# Patient Record
Sex: Male | Born: 1950
Health system: Southern US, Community
[De-identification: ages and names within clinical notes are randomized; demographics above are authoritative.]

## PROBLEM LIST (undated history)

## (undated) DIAGNOSIS — E7800 Pure hypercholesterolemia, unspecified: Secondary | ICD-10-CM

## (undated) DIAGNOSIS — K219 Gastro-esophageal reflux disease without esophagitis: Secondary | ICD-10-CM

## (undated) DIAGNOSIS — M5416 Radiculopathy, lumbar region: Secondary | ICD-10-CM

## (undated) DIAGNOSIS — R972 Elevated prostate specific antigen [PSA]: Secondary | ICD-10-CM

## (undated) DIAGNOSIS — R3915 Urgency of urination: Secondary | ICD-10-CM

## (undated) DIAGNOSIS — I10 Essential (primary) hypertension: Secondary | ICD-10-CM

## (undated) DIAGNOSIS — M549 Dorsalgia, unspecified: Secondary | ICD-10-CM

## (undated) DIAGNOSIS — IMO0002 Reserved for concepts with insufficient information to code with codable children: Secondary | ICD-10-CM

## (undated) DIAGNOSIS — M1990 Unspecified osteoarthritis, unspecified site: Secondary | ICD-10-CM

## (undated) DIAGNOSIS — F329 Major depressive disorder, single episode, unspecified: Secondary | ICD-10-CM

## (undated) DIAGNOSIS — F32A Depression, unspecified: Secondary | ICD-10-CM

## (undated) DIAGNOSIS — S0291XA Unspecified fracture of skull, initial encounter for closed fracture: Secondary | ICD-10-CM

## (undated) DIAGNOSIS — M545 Low back pain, unspecified: Secondary | ICD-10-CM

## (undated) DIAGNOSIS — G8929 Other chronic pain: Secondary | ICD-10-CM

## (undated) DIAGNOSIS — M48061 Spinal stenosis, lumbar region without neurogenic claudication: Secondary | ICD-10-CM

## (undated) DIAGNOSIS — N400 Enlarged prostate without lower urinary tract symptoms: Secondary | ICD-10-CM

## (undated) HISTORY — PX: KNEE ARTHROSCOPY: SUR90

## (undated) HISTORY — PX: COLONOSCOPY: SHX174

---

## 1980-12-02 DIAGNOSIS — S0291XA Unspecified fracture of skull, initial encounter for closed fracture: Secondary | ICD-10-CM

## 1980-12-02 HISTORY — DX: Unspecified fracture of skull, initial encounter for closed fracture: S02.91XA

## 2005-09-16 ENCOUNTER — Emergency Department: Payer: Self-pay | Admitting: Unknown Physician Specialty

## 2006-10-02 ENCOUNTER — Ambulatory Visit: Payer: Self-pay | Admitting: Internal Medicine

## 2008-10-08 ENCOUNTER — Ambulatory Visit: Payer: Self-pay | Admitting: Internal Medicine

## 2008-10-21 ENCOUNTER — Encounter: Admission: RE | Admit: 2008-10-21 | Discharge: 2008-10-21 | Payer: Self-pay | Admitting: Neurological Surgery

## 2008-12-02 HISTORY — PX: CHOLECYSTECTOMY: SHX55

## 2008-12-02 HISTORY — PX: BACK SURGERY: SHX140

## 2008-12-15 ENCOUNTER — Inpatient Hospital Stay (HOSPITAL_COMMUNITY): Admission: RE | Admit: 2008-12-15 | Discharge: 2008-12-16 | Payer: Self-pay | Admitting: Neurological Surgery

## 2008-12-23 ENCOUNTER — Encounter: Admission: RE | Admit: 2008-12-23 | Discharge: 2008-12-23 | Payer: Self-pay | Admitting: Neurological Surgery

## 2009-01-31 ENCOUNTER — Encounter: Admission: RE | Admit: 2009-01-31 | Discharge: 2009-01-31 | Payer: Self-pay | Admitting: Neurological Surgery

## 2009-02-20 ENCOUNTER — Encounter: Admission: RE | Admit: 2009-02-20 | Discharge: 2009-02-20 | Payer: Self-pay | Admitting: Neurological Surgery

## 2009-03-14 ENCOUNTER — Encounter: Admission: RE | Admit: 2009-03-14 | Discharge: 2009-03-14 | Payer: Self-pay | Admitting: Neurological Surgery

## 2009-05-05 ENCOUNTER — Encounter: Admission: RE | Admit: 2009-05-05 | Discharge: 2009-05-05 | Payer: Self-pay | Admitting: Neurological Surgery

## 2009-05-15 ENCOUNTER — Encounter: Admission: RE | Admit: 2009-05-15 | Discharge: 2009-05-15 | Payer: Self-pay | Admitting: Neurological Surgery

## 2009-05-15 ENCOUNTER — Inpatient Hospital Stay (HOSPITAL_COMMUNITY): Admission: EM | Admit: 2009-05-15 | Discharge: 2009-05-19 | Payer: Self-pay | Admitting: Emergency Medicine

## 2009-05-16 ENCOUNTER — Encounter (INDEPENDENT_AMBULATORY_CARE_PROVIDER_SITE_OTHER): Payer: Self-pay | Admitting: Surgery

## 2009-07-18 ENCOUNTER — Encounter: Admission: RE | Admit: 2009-07-18 | Discharge: 2009-07-18 | Payer: Self-pay | Admitting: Neurological Surgery

## 2009-07-25 ENCOUNTER — Encounter: Admission: RE | Admit: 2009-07-25 | Discharge: 2009-07-25 | Payer: Self-pay | Admitting: Neurological Surgery

## 2009-08-23 ENCOUNTER — Inpatient Hospital Stay (HOSPITAL_COMMUNITY): Admission: RE | Admit: 2009-08-23 | Discharge: 2009-08-25 | Payer: Self-pay | Admitting: Neurological Surgery

## 2009-09-04 ENCOUNTER — Ambulatory Visit: Payer: Self-pay | Admitting: Neurological Surgery

## 2009-09-12 ENCOUNTER — Encounter: Admission: RE | Admit: 2009-09-12 | Discharge: 2009-09-12 | Payer: Self-pay | Admitting: Neurological Surgery

## 2009-11-21 ENCOUNTER — Encounter: Admission: RE | Admit: 2009-11-21 | Discharge: 2009-11-21 | Payer: Self-pay | Admitting: Neurological Surgery

## 2009-12-22 ENCOUNTER — Encounter: Admission: RE | Admit: 2009-12-22 | Discharge: 2009-12-22 | Payer: Self-pay | Admitting: Neurological Surgery

## 2010-05-07 ENCOUNTER — Encounter: Admission: RE | Admit: 2010-05-07 | Discharge: 2010-05-07 | Payer: Self-pay | Admitting: Neurological Surgery

## 2010-08-04 IMAGING — CR DG LUMBAR SPINE 2-3V
3 series · 3 of 3 positions shown · non-contrast
Comparison: Lumbar spine films of 03/14/2009

CLINICAL DATA: Lumbar spine surgery in December 2008, pain in the
low back, follow-up

LUMBAR SPINE - 2-3 VIEW

[t l-spine a.p.]
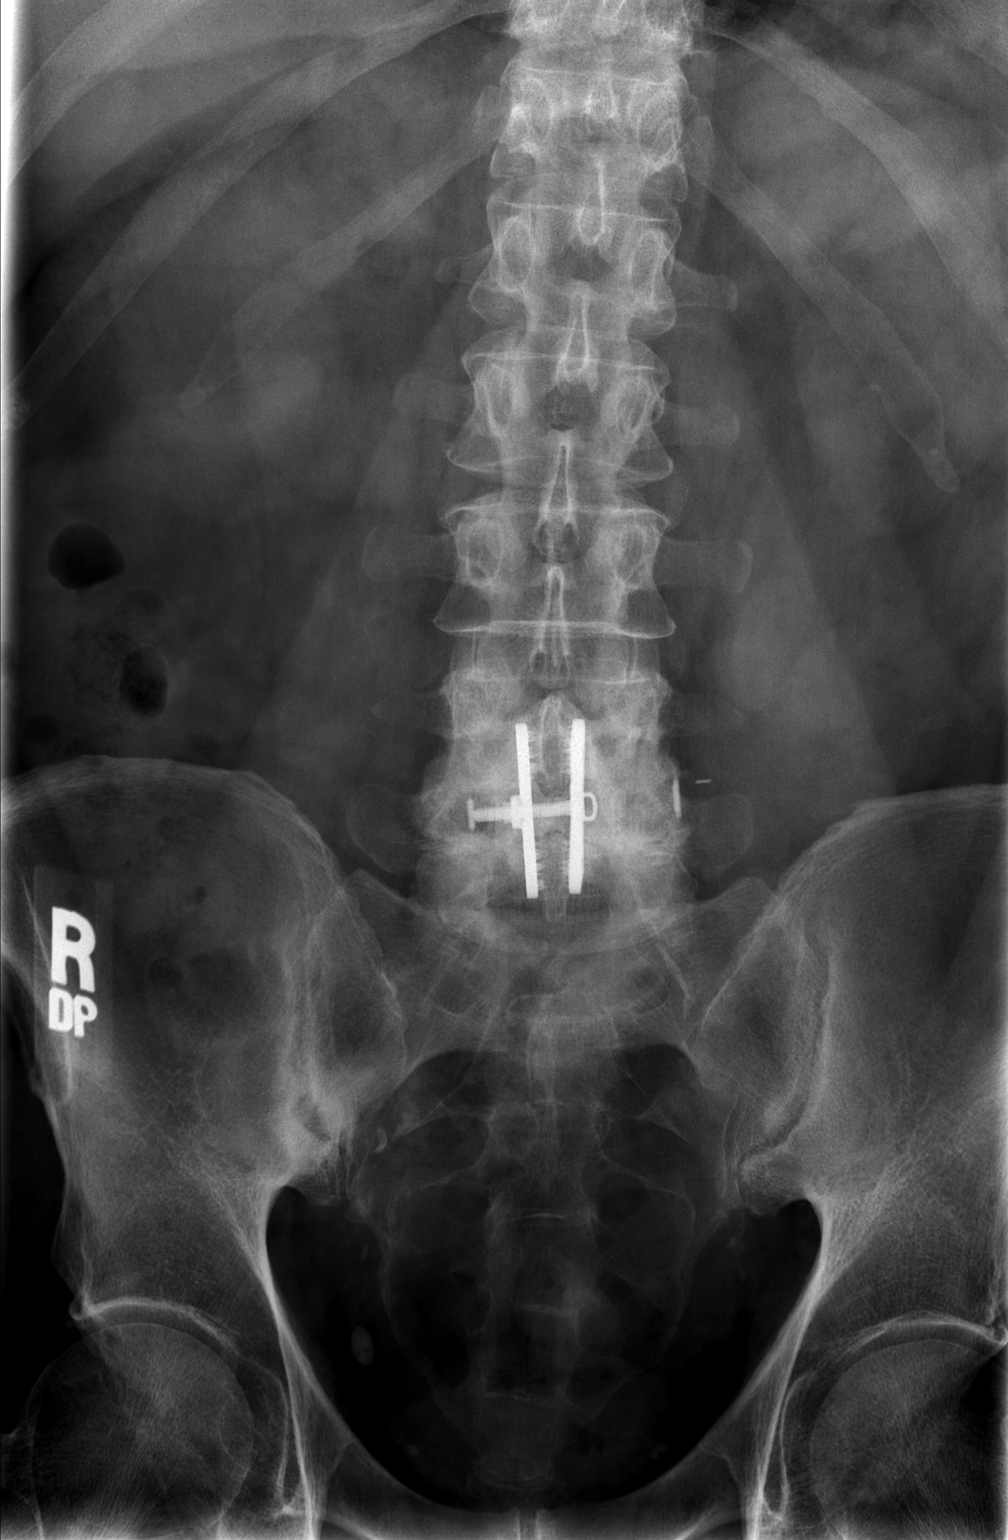

[t l-spine lat]
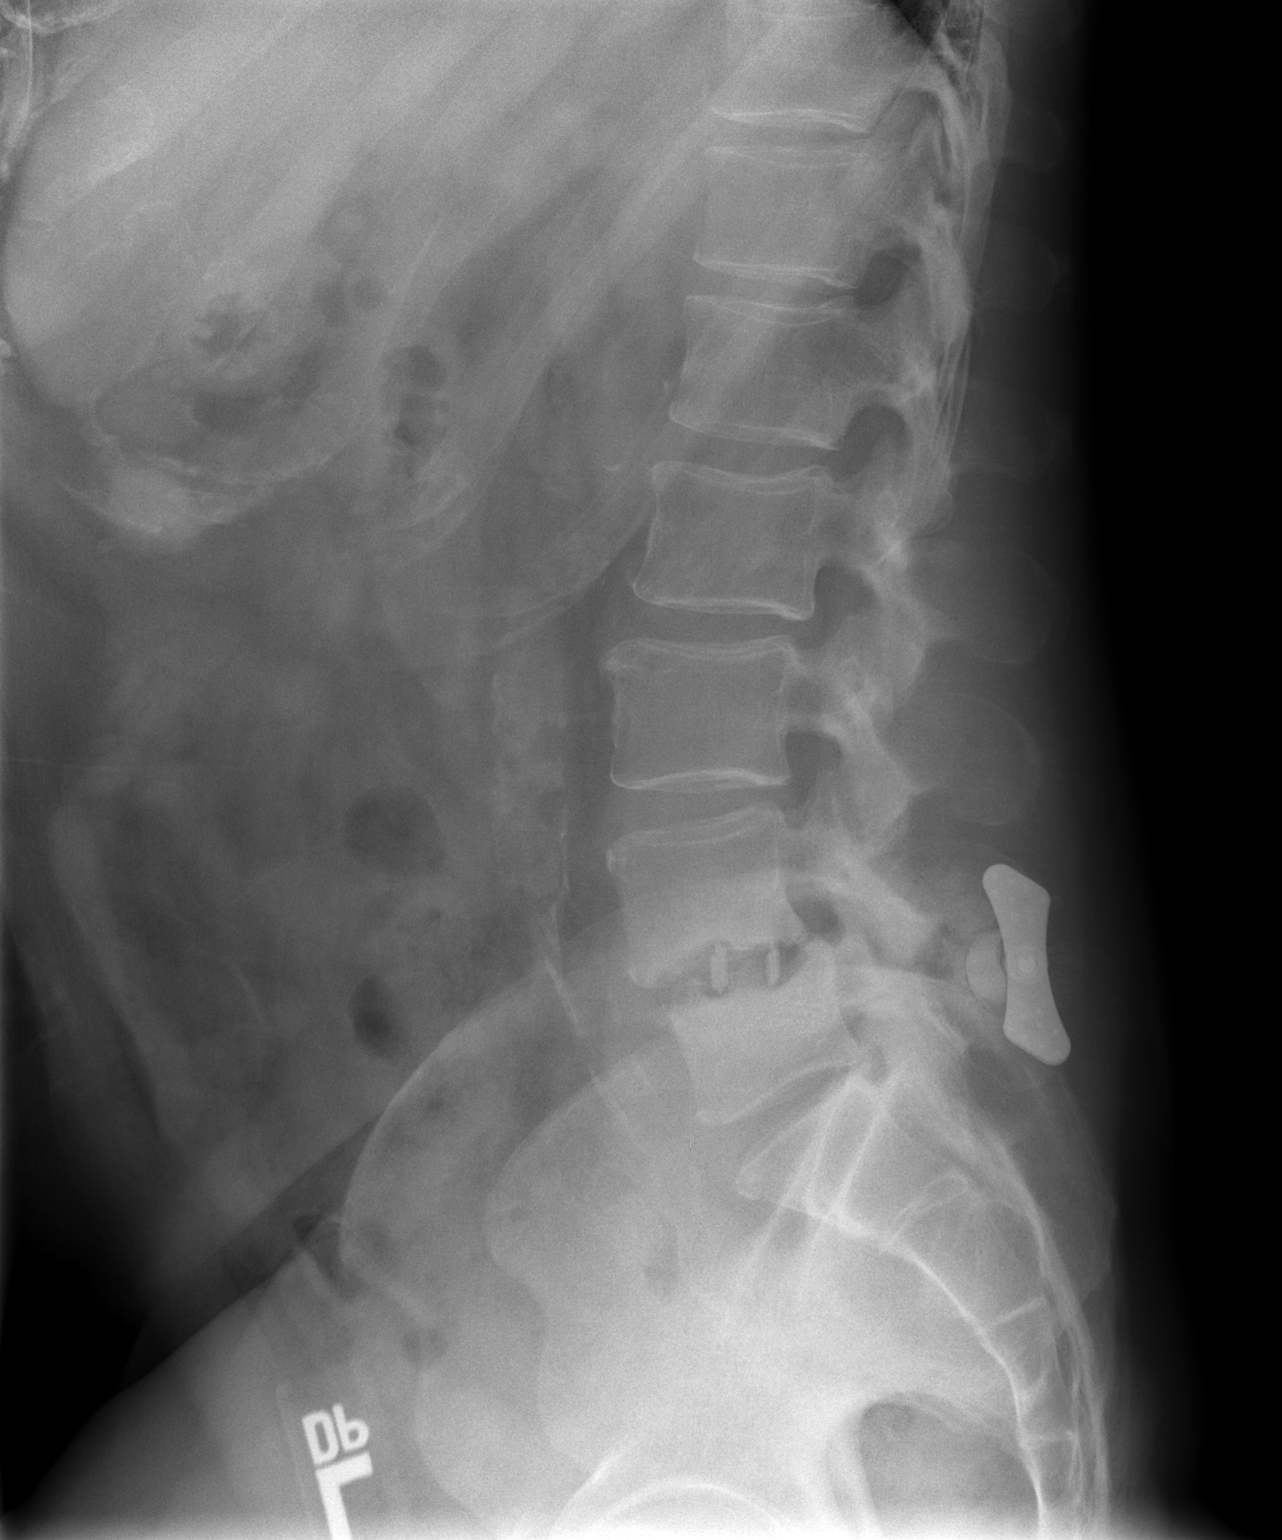

[t l-spine l5-s1 spot]
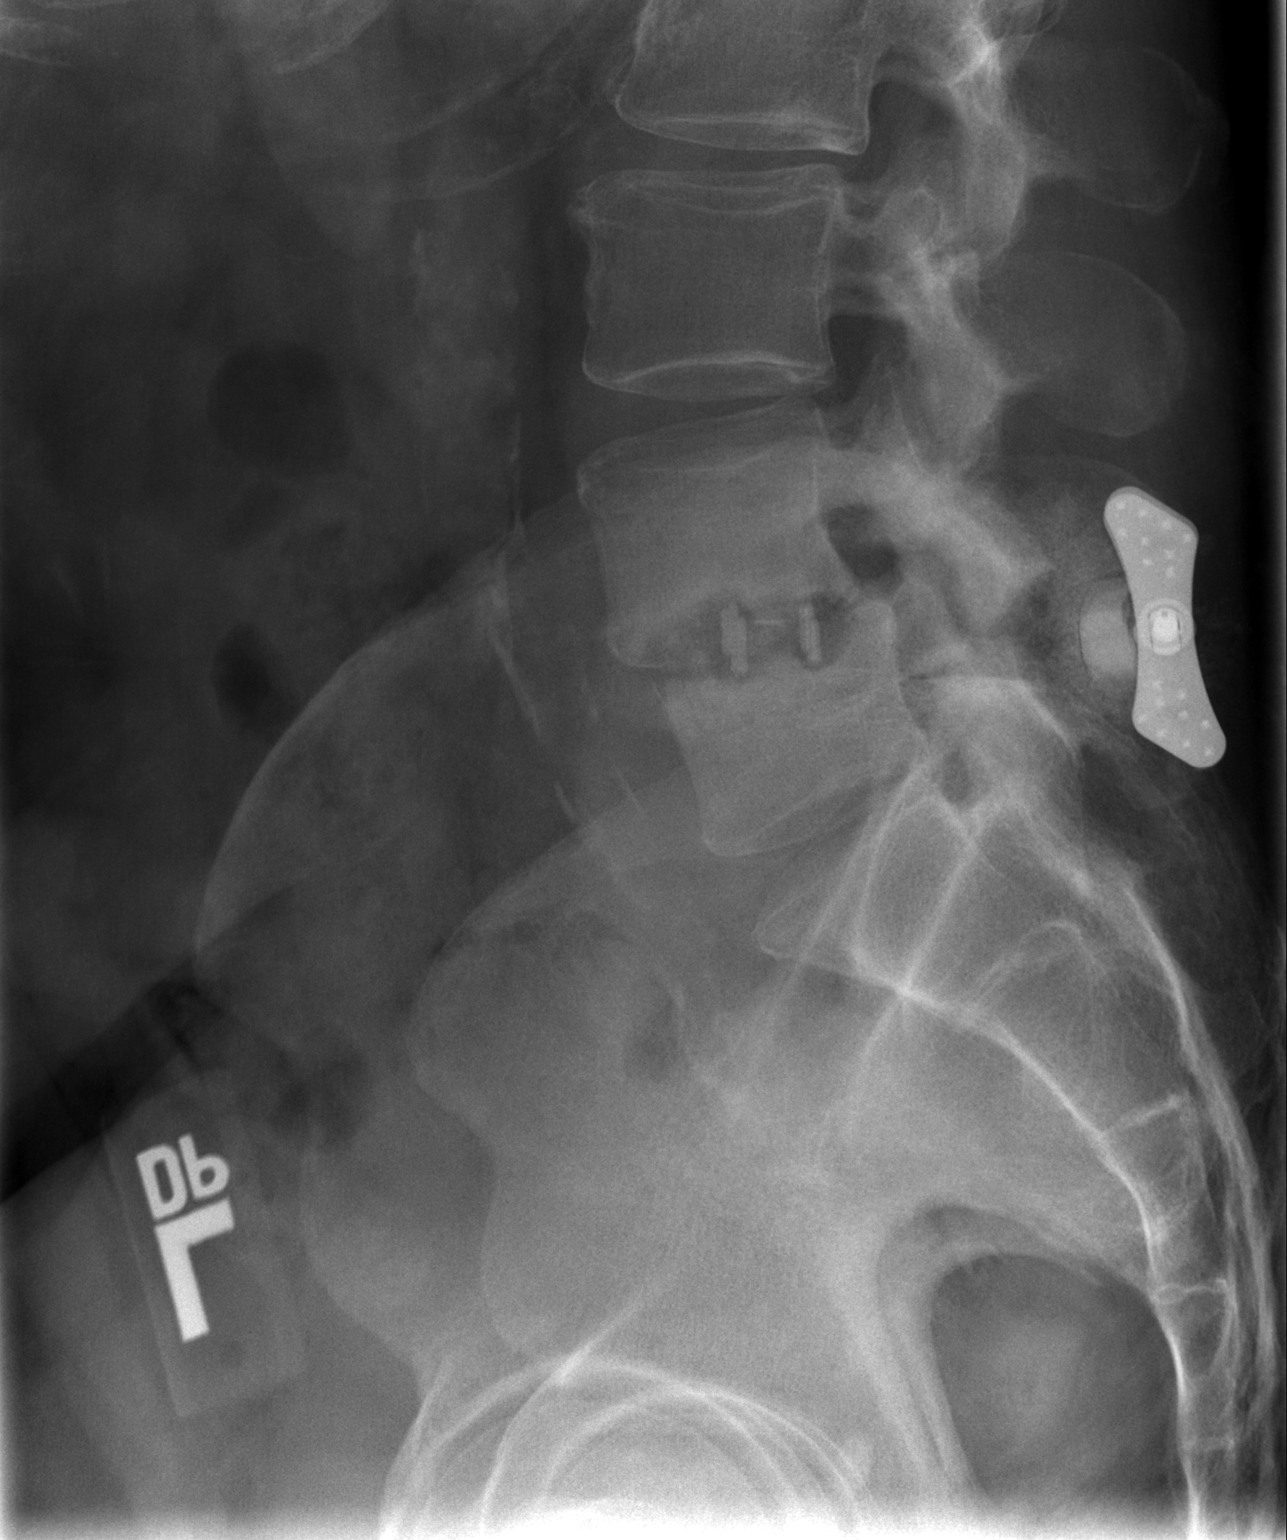

[3 of 3 positions shown; findings below may reference images not displayed]

FINDINGS: The previously noted lucency surrounding the interbody
spacer at L4-5 has increased somewhat, which may indicate
resorption or possibly infection.  Again some subsidence of the
interbody spacer at L4-5 into the superior aspect of L5 is noted.
There is little change in very mild anterolisthesis of L4 on L5.
Interspinous spacer appears unchanged.  No other significant change
is seen.
IMPRESSION: 1.  Slightly more lucency surrounds the interbody spacer at L4-5
which may indicate resorption or possibly infection.
 2.  Little change to slight increase in subsidence into the
superior aspect of L5.

## 2010-08-14 IMAGING — CT CT L SPINE W/O CM
4 of 11 series · 10 of 33 positions shown, 12 images · non-contrast
Comparison: Plain film series from [DATE], [DATE], [DATE] [DATE].

CLINICAL DATA: Low back pain.  Bilateral leg pain.

CT LUMBAR SPINE WITHOUT CONTRAST
TECHNIQUE: Multidetector CT imaging of the lumbar spine was
performed without intravenous contrast administration. Multiplanar
CT image reconstructions were also generated.

[Series 3: bone windows · axial · 0.27mm/px · z∈[-223,-143]mm · 2 of 97 slices shown, 3 images]
[im 33/97  soft-tissue]
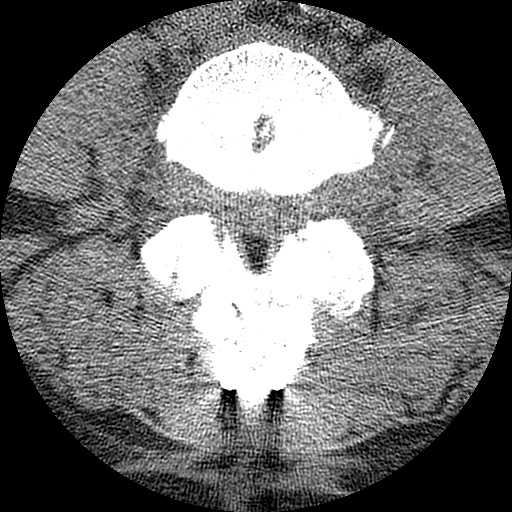
[im 33/97  bone]
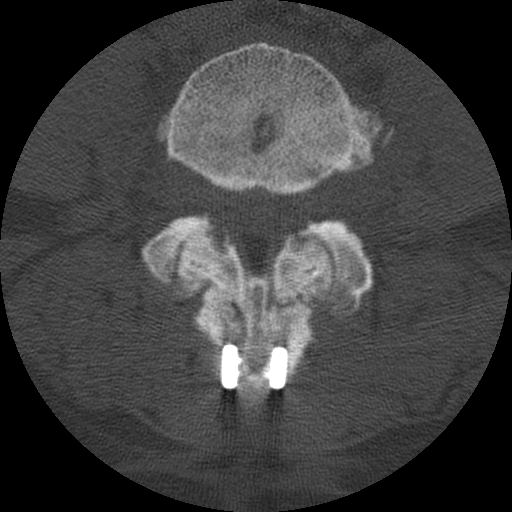
[im 65/97  bone]
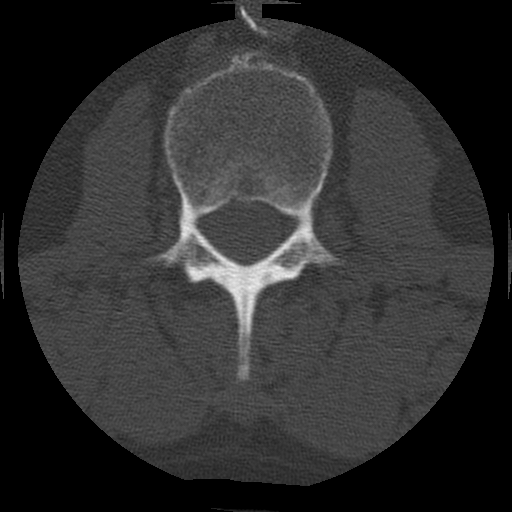

[Series 4: detail windows · axial · 0.27mm/px · z∈[-223,-143]mm · 2 of 97 slices shown]
[im 33/97  bone]
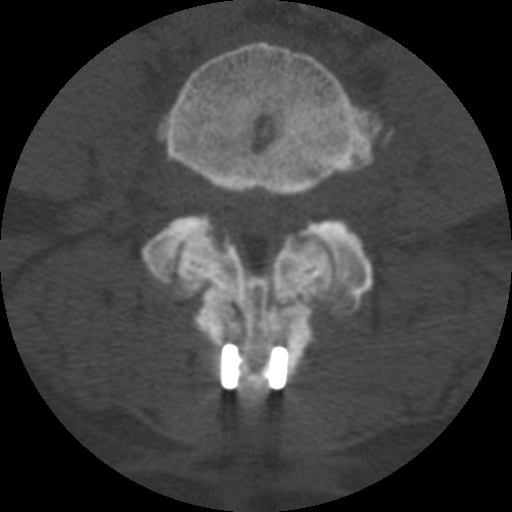
[im 65/97  bone]
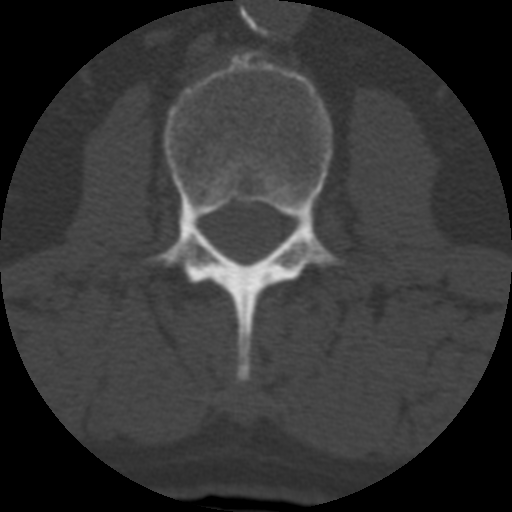

[Series 201: cor lower l-spine · coronal · 0.48mm/px · 1 of 46 slices shown]
[im 23/46  bone]
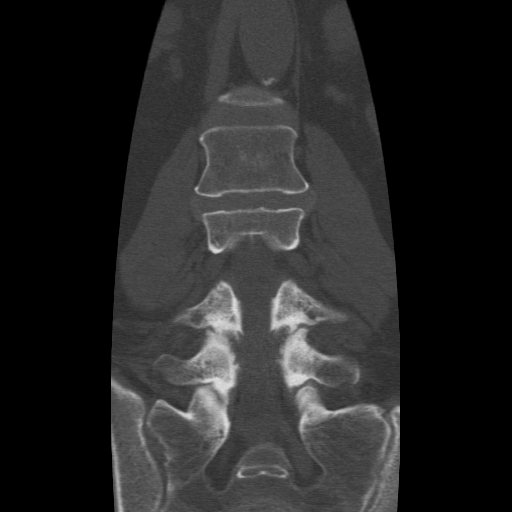

[Series 202: sag l-spine · sagittal · 0.48mm/px · 5 of 44 slices shown, 6 images]
[im 15/44  bone]
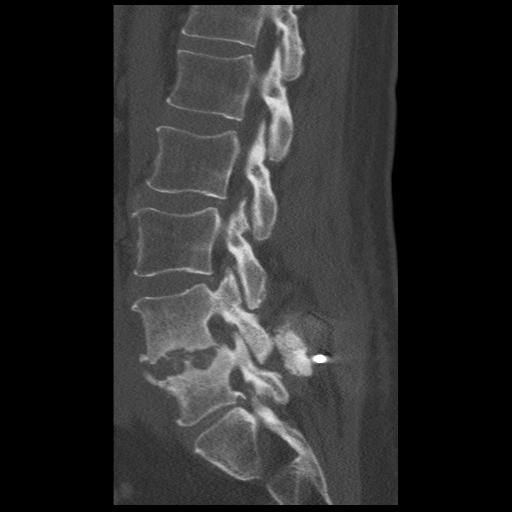
[im 18/44  bone]
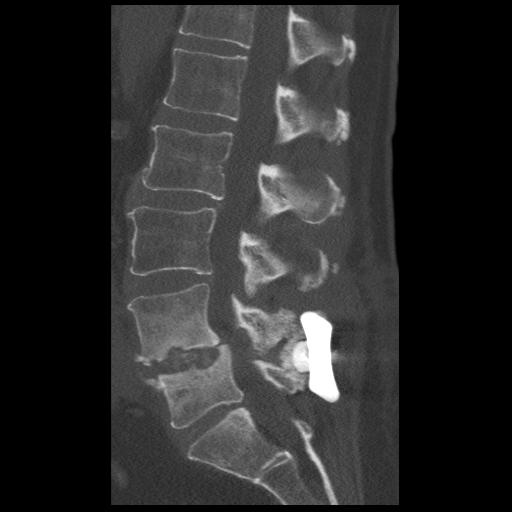
[im 22/44  soft-tissue]
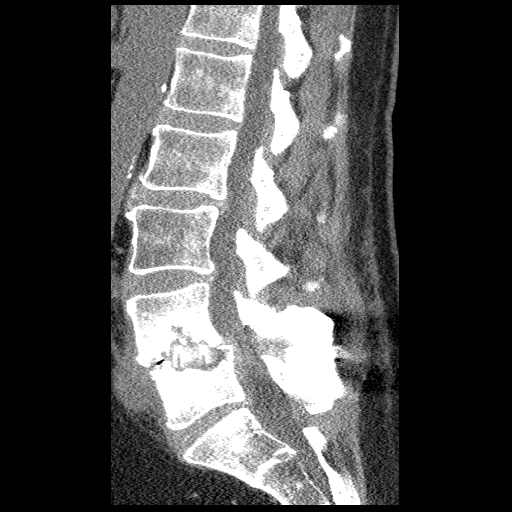
[im 22/44  bone]
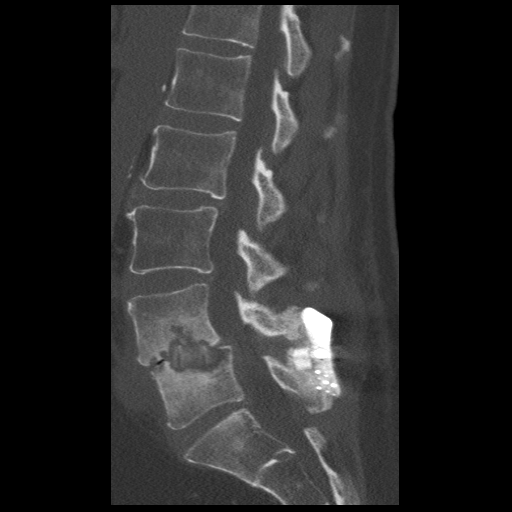
[im 26/44  bone]
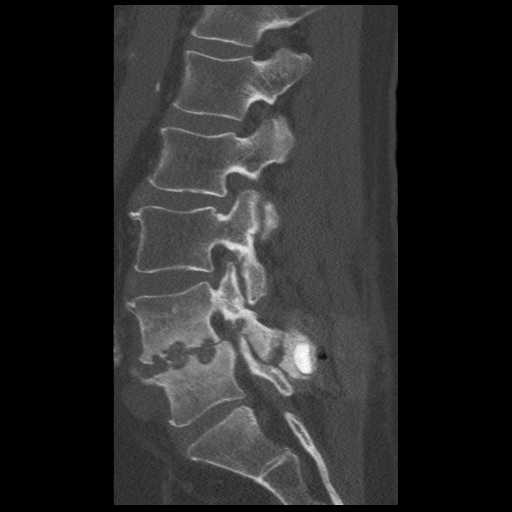
[im 29/44  bone]
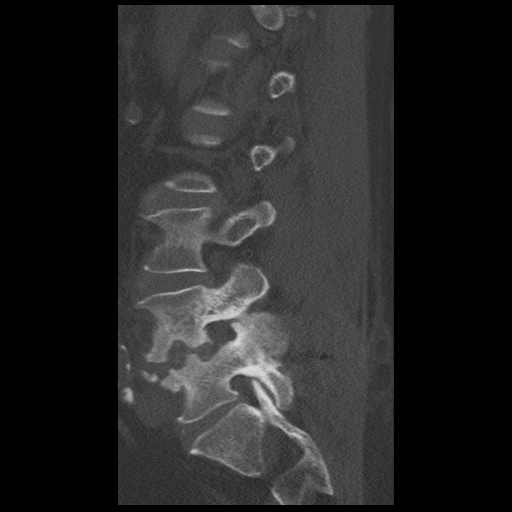

[10 of 33 positions shown; findings below may reference images not displayed]

FINDINGS: The grade 1 anterolisthesis of L4 on L5 appears little
changed compared to the preoperative radiograph of 10/21/2008.
There are however destructive changes of bone on both sides of the
L4-L5 endplate.  Disc spacer is present at L4-L5 and an X-STOP (or
similar) device is present posteriorly, with posterior fusion mass
around the spinous processes of L4 and L5.  Compared to prior plain
films, there has been subsidence of the interbody spacer into the
endplates of L4 and L5, with surrounding sclerosis.  The foramina
at L4-L5 are mildly stenotic associated with loss of disc space and
soft tissue in the inferior aspect of the foramina bilaterally
which could represent residual disc material versus scar/fibrosis.

T12-L1, L1-L2 appear normal.  L2-L3 shows circumferential disc
bulge with partially calcified disc.  Mild central stenosis is
present associated with ligamentum flavum redundancy and bulging
disc.  The disc extends into both foramina with left greater than
right foraminal stenosis.

L3-L4 also shows a broad-based posterior disc bulge which extends
into both neural foramina, producing left greater than right
foraminal stenosis.

L5-S1 shows bony right foraminal stenosis.  The central canal
appears adequately patent.  There is a broad-based posterior disc
bulge.  No definite left foraminal stenosis is identified.
Endplate osteophyte as well as disc contribute to right foraminal
stenosis.
IMPRESSION: 1.  Postoperative changes L4-L5 with osteolysis around the
interbody spacer and sclerosis of the adjacent vertebral endplates
beyond the zone of osteolysis.  The appearance is most consistent
with ongoing motion at this level, with infection felt less likely
based on sclerosis.  Spinous process fixation device posteriorly
does not show definite loosening and the bone graft is coalescing.
2.  L2-L3 and L3-L4 broad-based disc bulges.
3.  Right L5 bony foraminal stenosis associated with endplate spur.

## 2010-11-20 ENCOUNTER — Encounter
Admission: RE | Admit: 2010-11-20 | Discharge: 2010-11-20 | Payer: Self-pay | Source: Home / Self Care | Attending: Neurological Surgery | Admitting: Neurological Surgery

## 2011-03-08 LAB — URINALYSIS, ROUTINE W REFLEX MICROSCOPIC
Ketones, ur: NEGATIVE mg/dL
Nitrite: NEGATIVE
Protein, ur: NEGATIVE mg/dL
pH: 5.5 (ref 5.0–8.0)

## 2011-03-08 LAB — COMPREHENSIVE METABOLIC PANEL
Albumin: 4.3 g/dL (ref 3.5–5.2)
BUN: 14 mg/dL (ref 6–23)
Calcium: 9.9 mg/dL (ref 8.4–10.5)
Chloride: 104 mEq/L (ref 96–112)
Creatinine, Ser: 0.81 mg/dL (ref 0.4–1.5)
Total Bilirubin: 0.5 mg/dL (ref 0.3–1.2)

## 2011-03-08 LAB — ABO/RH: ABO/RH(D): A POS

## 2011-03-08 LAB — BASIC METABOLIC PANEL
CO2: 28 mEq/L (ref 19–32)
Chloride: 101 mEq/L (ref 96–112)
GFR calc Af Amer: >60 mL/min (ref >60)
Sodium: 136 mEq/L (ref 135–145)

## 2011-03-08 LAB — CBC
HCT: 38.1 % — ABNORMAL LOW (ref 39.0–52.0)
MCHC: 34 g/dL (ref 30.0–36.0)
MCV: 89 fL (ref 78.0–100.0)
Platelets: 402 10*3/uL — ABNORMAL HIGH (ref 150–400)
RDW: 15.8 % — ABNORMAL HIGH (ref 11.5–15.5)

## 2011-03-08 LAB — DIFFERENTIAL
Basophils Absolute: 0 10*3/uL (ref 0.0–0.1)
Lymphocytes Relative: 31 % (ref 12–46)
Lymphs Abs: 2.6 10*3/uL (ref 0.7–4.0)
Monocytes Absolute: 0.5 10*3/uL (ref 0.1–1.0)
Neutro Abs: 5.2 10*3/uL (ref 1.7–7.7)

## 2011-03-08 LAB — TISSUE CULTURE: Culture: NO GROWTH

## 2011-03-08 LAB — PROTIME-INR: Prothrombin Time: 11.9 seconds (ref 11.6–15.2)

## 2011-03-08 LAB — APTT: aPTT: 23 seconds — ABNORMAL LOW (ref 24–37)

## 2011-03-11 LAB — COMPREHENSIVE METABOLIC PANEL
ALT: 22 U/L (ref 0–53)
ALT: 22 U/L (ref 0–53)
AST: 27 U/L (ref 0–37)
Alkaline Phosphatase: 106 U/L (ref 39–117)
Alkaline Phosphatase: 85 U/L (ref 39–117)
BUN: 9 mg/dL (ref 6–23)
CO2: 27 mEq/L (ref 19–32)
CO2: 30 mEq/L (ref 19–32)
Calcium: 9.2 mg/dL (ref 8.4–10.5)
Chloride: 98 mEq/L (ref 96–112)
Chloride: 99 mEq/L (ref 96–112)
Creatinine, Ser: 0.8 mg/dL (ref 0.4–1.5)
GFR calc Af Amer: >60 mL/min (ref >60)
GFR calc non Af Amer: >60 mL/min (ref >60)
GFR calc non Af Amer: >60 mL/min (ref >60)
Glucose, Bld: 126 mg/dL — ABNORMAL HIGH (ref 70–99)
Glucose, Bld: 133 mg/dL — ABNORMAL HIGH (ref 70–99)
Potassium: 4.4 mEq/L (ref 3.5–5.1)
Sodium: 135 mEq/L (ref 135–145)
Sodium: 137 mEq/L (ref 135–145)
Total Bilirubin: 1 mg/dL (ref 0.3–1.2)
Total Protein: 7.8 g/dL (ref 6.0–8.3)

## 2011-03-11 LAB — URINALYSIS, MICROSCOPIC ONLY
Ketones, ur: 15 mg/dL — AB
Ketones, ur: NEGATIVE mg/dL
Leukocytes, UA: NEGATIVE
Nitrite: NEGATIVE
Nitrite: NEGATIVE
Protein, ur: 100 mg/dL — AB
Specific Gravity, Urine: 1.022 (ref 1.005–1.030)
Specific Gravity, Urine: 1.029 (ref 1.005–1.030)
Urobilinogen, UA: 2 mg/dL — ABNORMAL HIGH (ref 0.0–1.0)
Urobilinogen, UA: 2 mg/dL — ABNORMAL HIGH (ref 0.0–1.0)
pH: 6.5 (ref 5.0–8.0)

## 2011-03-11 LAB — CBC
HCT: 31.5 % — ABNORMAL LOW (ref 39.0–52.0)
HCT: 32.9 % — ABNORMAL LOW (ref 39.0–52.0)
Hemoglobin: 11 g/dL — ABNORMAL LOW (ref 13.0–17.0)
Hemoglobin: 11.9 g/dL — ABNORMAL LOW (ref 13.0–17.0)
Hemoglobin: 13.4 g/dL (ref 13.0–17.0)
MCHC: 34 g/dL (ref 30.0–36.0)
MCV: 87.8 fL (ref 78.0–100.0)
MCV: 88.2 fL (ref 78.0–100.0)
RBC: 3.59 MIL/uL — ABNORMAL LOW (ref 4.22–5.81)
RBC: 3.73 MIL/uL — ABNORMAL LOW (ref 4.22–5.81)
RBC: 3.95 MIL/uL — ABNORMAL LOW (ref 4.22–5.81)
RDW: 14.1 % (ref 11.5–15.5)
WBC: 14.9 10*3/uL — ABNORMAL HIGH (ref 4.0–10.5)
WBC: 17.7 10*3/uL — ABNORMAL HIGH (ref 4.0–10.5)
WBC: 14.3 10*3/uL — ABNORMAL HIGH (ref 4.0–10.5)

## 2011-03-11 LAB — DIFFERENTIAL
Eosinophils Absolute: 0.1 10*3/uL (ref 0.0–0.7)
Eosinophils Absolute: 0 10*3/uL (ref 0.0–0.7)
Eosinophils Relative: 0 % (ref 0–5)
Lymphocytes Relative: 8 % — ABNORMAL LOW (ref 12–46)
Lymphs Abs: 1.3 10*3/uL (ref 0.7–4.0)
Lymphs Abs: 1.3 10*3/uL (ref 0.7–4.0)
Monocytes Relative: 5 % (ref 3–12)
Monocytes Relative: 6 % (ref 3–12)
Neutro Abs: 15.1 10*3/uL — ABNORMAL HIGH (ref 1.7–7.7)
Neutrophils Relative %: 86 % — ABNORMAL HIGH (ref 43–77)

## 2011-03-11 LAB — LIPASE, BLOOD: Lipase: 12 U/L (ref 11–59)

## 2011-03-11 LAB — SEDIMENTATION RATE: Sed Rate: 30 mm/hr — ABNORMAL HIGH (ref 0–16)

## 2011-03-11 LAB — URINE CULTURE: Culture: NO GROWTH

## 2011-03-11 LAB — URINALYSIS, ROUTINE W REFLEX MICROSCOPIC
Bilirubin Urine: NEGATIVE
Ketones, ur: NEGATIVE mg/dL
Nitrite: NEGATIVE
Protein, ur: NEGATIVE mg/dL
Urobilinogen, UA: 0.2 mg/dL (ref 0.0–1.0)

## 2011-03-11 LAB — C-REACTIVE PROTEIN: CRP: 3.8 mg/dL — ABNORMAL HIGH (ref <0.6)

## 2011-03-11 LAB — BASIC METABOLIC PANEL
Chloride: 97 mEq/L (ref 96–112)
GFR calc Af Amer: >60 mL/min (ref >60)
GFR calc non Af Amer: >60 mL/min (ref >60)
Potassium: 3.5 mEq/L (ref 3.5–5.1)
Sodium: 135 mEq/L (ref 135–145)

## 2011-03-18 LAB — BASIC METABOLIC PANEL
BUN: 11 mg/dL (ref 6–23)
Creatinine, Ser: 1 mg/dL (ref 0.4–1.5)
GFR calc non Af Amer: >60 mL/min (ref >60)

## 2011-03-18 LAB — CBC
MCV: 89.4 fL (ref 78.0–100.0)
Platelets: 367 10*3/uL (ref 150–400)
RDW: 13.8 % (ref 11.5–15.5)
WBC: 9.1 10*3/uL (ref 4.0–10.5)

## 2011-03-18 LAB — PROTIME-INR
INR: 0.9 (ref 0.00–1.49)
Prothrombin Time: 12.1 seconds (ref 11.6–15.2)

## 2011-03-18 LAB — DIFFERENTIAL
Basophils Absolute: 0 10*3/uL (ref 0.0–0.1)
Eosinophils Absolute: 0.1 10*3/uL (ref 0.0–0.7)
Lymphocytes Relative: 33 % (ref 12–46)
Lymphs Abs: 3 10*3/uL (ref 0.7–4.0)
Neutrophils Relative %: 57 % (ref 43–77)

## 2011-04-16 NOTE — Discharge Summary (Signed)
NAMEFERLANDO, Scott Trevino                 ACCOUNT NO.:  [id number]   MEDICAL RECORD NO.:  [id number]          PATIENT TYPE:  INP   LOCATION:  5127                         FACILITY:  [facility]   PHYSICIAN:  Scott Trevino, M.D.  DATE OF BIRTH:  [birth year]   DATE OF ADMISSION:  05/15/2009  DATE OF DISCHARGE:  05/19/2009                               DISCHARGE SUMMARY   ADMITTING PHYSICIAN:  Scott Gosling, MD.   DISCHARGING PHYSICIAN:  Scott Trevino, M.D.   PROCEDURES:  Laparoscopic cholecystectomy with intraoperative  cholangiogram by Dr. Ovidio Kin on May 16, 2009.   CONSULTANTS:  None.   REASON FOR ADMISSION:  Scott Trevino is a 60 year old male with history of L-  spine fusion by Dr. Marikay Alar in January 2010.  He has been followed  for the last 3 weeks with some increasing lower back pain.  He underwent  some plain films as well as CT of his L-spine today the day of  admission.  The patient states that since this past Saturday he has  developed some diffuse abdominal pain that has worsened in the right  upper quadrant and epigastric region with some nausea and couple  episodes of emesis.  Therefore, he came to the emergency department  where he had an ultrasound that showed gallbladder wall thickening with  pericholecystic fluid and gallstones.  He was also noted to have a white  count of 17,600.  Please see admitting history and physical for further  details.   ADMITTING DIAGNOSES:  1. Cholecystitis.  2. Low back pain status post fusion.   HOSPITAL COURSE:  At this time, the patient was admitted and placed on  Unasyn.  The following morning, the patient was seen and then taken to  the operating room where a laparoscopy cholecystectomy with  intraoperative cholangiogram was performed.  At this time, it was found  that the patient had acute and chronic cholecystitis with  cholelithiasis.  The patient tolerated this procedure well.   During the patient's postoperative days,  he spiked several fevers up to  102.9 for the first 48 hours.  His white count went down on  postoperative day #1 to 14300 but increased on postoperative day #2 to  17000.  At this time, because of his fevers and white count, he was  switched from Cipro postoperatively to Invanz.  Urinalysis as well as  chest x-ray was also obtained which was normal.  Also during this time,  his diet was advanced as tolerated.  By postoperative day #3, the  patient states that he was feeling really well.  He was tolerating a  regular diet and otherwise had been not having any fevers greater than  100.6 for the last 24 hours.  At this time, his white count had  decreased again down to 14000.  The patient denied any problems  urinating, chest pain or shortness of breath or any other symptoms that  would explain his possible white count.  At this time, the patient was  felt stable for discharge home.   DISCHARGE DIAGNOSES:  1. Acute on  chronic cholecystitis.  2. Status post laparoscopic cholecystectomy.  3. Low back pain.  4. Status post fusion.  5. Leukocytosis.  6. Fever.   DISCHARGE MEDICATIONS:  1. Lotrel 40 mg daily.  2. Simvastatin 80 mg daily.  3. Oxycodone 5/325 p.r.n.  4. Aspirin 81 mg daily.  5. Omeprazole 20 mg b.i.d.  6. Multivitamin daily.  7. Cyclobenzaprine 10 mg t.i.d.  8. Cymbalta 60 mg daily.  9. He is given prescriptions for Percocet 5/325 one to two q.4 h.      p.r.n. pain in case he runs out of his supply at home.  10.Cipro 500 mg one p.o. b.i.d.x7 days.  11.Flagyl 500 mg one p.o. t.i.d. x7 days.   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  He has no dietary restrictions.  He may increase his  activity slowly and walk up steps.  He may shower.  He is not to lift anything greater than approximately 15 pounds for the  next 2 weeks.  Otherwise, he is to return to the  [facility] at our  office on May 30, 2009, at 3 :15 p.m.  He is otherwise told to give our office a call if he begins to spike  fevers greater than 101.5 or begins to get severe worsening pain.      Scott Cape, PA      Scott Trevino, M.D.  Electronically Signed    KEO/MEDQ  D:  05/19/2009  T:  05/20/2009  Job:  [id number]   cc:   Tia Alert, MD

## 2011-04-16 NOTE — Op Note (Signed)
[name], [name]                 ACCOUNT NO.:  [id number]   MEDICAL RECORD NO.:  [id number]          PATIENT TYPE:  INP   LOCATION:  3023                         FACILITY:  [facility]   PHYSICIAN:  Tia Alert, MD     DATE OF BIRTH:  04-14-1951   DATE OF PROCEDURE:  12/15/2008  DATE OF DISCHARGE:                               OPERATIVE REPORT   PREOPERATIVE DIAGNOSIS:  Grade 1 spondylolisthesis L4-5 with spinal  stenosis, back and leg pain.   POSTOPERATIVE DIAGNOSIS:  Grade 1 spondylolisthesis L4-5 with spinal  stenosis, back and leg pain.   PROCEDURE:  1. Anterior retroperitoneal interbody fusion L4-5 utilizing a 55-mm      PEEK interbody cage packed with Osteocel Plus.  2. Interspinous fixation L4-5 utilizing the NuVasive interspinous      plating system.  3. Interspinous fusion L4-5 utilizing a cortical allograft.  4. Posterior interlaminar arthrodesis L4-5 utilizing Actifuse putty.   SURGEON:  Tia Alert, MD   ASSISTANT:  Reinaldo Meeker, MD   ANESTHESIA:  General endotracheal.   COMPLICATIONS:  None apparent.   INDICATIONS FOR PROCEDURE:  [name] is a 60 year old gentleman who  presented with back and bilateral leg pain.  He was found to have  segmental instability at L4-5 with a grade 1 anterolisthesis of L4 and  L5 with some lateral recess stenosis, recommended an instrumented fusion  at L4-5 via an anterior retroperitoneal approach with posterior fixation  and fusion.  He understood the risks, benefits, suspected outcome and  wished to proceed.   DESCRIPTION OF PROCEDURE:  The patient was taken to operating room and  after induction of adequate generalized endotracheal anesthesia, he was  placed in the lateral position with his right side down.  He was taped  into position with his legs flexed and its position was checked with an  AP and lateral fluoroscopy.  Intraoperative neurophysiologic monitoring  was used with EMG monitoring and no paralytic was used.   The patient's  lateral incision was marked utilizing fluoroscopy and then two incisions  were made one direct lateral and one just posterior to that and then  using finger dissection, I was able to dissect through the fascial  layers to identify the retroperitoneal space on the patient's left side.  I was able to identify a transverse process and then tweaked my finger  superiorly work through the soft tissues and then take by first dilator  down to the lateral incision to the psoas musculature on the left side.  Position was checked with fluoroscopy and then I was able to put the  first dilator through the size musculature to the disk space in the  middle of the disk space and then used sequential dilation hooking each  dilator into neural monitoring.  EMG monitoring to assure as best we  could that we were not close to neural structures.  We had good readings  throughout without.  Final retractor was placed and locked into  position.  We opened this slightly and also checked with monitoring  there and then checked our exposure  with neural monitoring.  A shim was  then put into place and disk space was incised and diskectomy was done  with pituitary rongeurs, curettes, and scrapers.  I was able to open the  opposite lateral annulus with Cobb dissectors, then I used an 8 and then  a 10-mm trial and was able to tapped these into position.  We felt we  needed a 10-mm lordotic trial 55 mm in length.  We therefore packed a  PEEK cage of those dimensions with Osteocel Plus and tapped this into  position at L4-5 from the lateral approach under fluoroscopy.  Once this  was in position, we dried all bleeding points, checked the final  position with AP and lateral fluoroscopy, then slowly back to retract  out checking for any bleeding as we went.  The surgical bed was dried.  I then closed the 2 incisions with 0, 2-0, and 3-0 Vicryl in layers.  The skin was closed with Benzoin and Steri-Strips.   We then turned the  patient onto his abdomen into the prone position.  His lumbar region was  prepped with DuraPrep and then draped in usual sterile fashion.  Dorsal  midline incision was made and carried down to the lumbosacral fascia.  The fascia was opened and the paraspinous musculature was taken down in  subperiosteal fashion to expose L4-5.  I removed the interspinous  ligament, prepared the bone of the spinous processes for arthrodesis,  decorticated the lamina of L4 and L5, then placed Actifuse putty over  these to perform interlaminar arthrodesis, placed a cortical graft and  between the spinous processes to help avoid extension, but also to  hopefully produce fusion and then used NuVasive interspinous plate and  squeezed this into position at L4-5.  I then checked my final construct  with AP and lateral fluoroscopy to assure adequate placement.  The  construct looked quite good at L4-5.  Therefore, irrigated with saline  solution, dried all bleeding points, placed a medium Hemovac drain and  then closed the fascia with 0 Vicryl, closed the subcutaneous and  subcuticular tissue with 2-0 and 3-0 Vicryl, and closed the skin with  benzoin and Steri-Strips.  The drapes were removed.  A sterile dressing  was applied.  The patient was awakened from general anesthesia and  transferred to the recovery room in stable condition.  At the end of the  procedure, all sponge, needle, and instrument counts were correct.      Tia Alert, MD  Electronically Signed     DSJ/MEDQ  D:  12/15/2008  T:  12/16/2008  Job:  [id number]

## 2011-04-16 NOTE — H&P (Signed)
NAMECAROLOS, FECHER                 ACCOUNT NO.:  [id number]   MEDICAL RECORD NO.:  [id number]          PATIENT TYPE:  INP   LOCATION:  5127                         FACILITY:  [facility]   PHYSICIAN:  Juanetta Gosling, MDDATE OF BIRTH:  08/25/1951   DATE OF ADMISSION:  05/15/2009  DATE OF DISCHARGE:                              HISTORY & PHYSICAL   CONSULTING PHYSICIAN:  Orlene Och, MD   CHIEF COMPLAINT:  Abdominal pain.   PRIMARY CARE PHYSICIAN:  Dr. Judithann Sheen of [facility].   HISTORY OF PRESENT ILLNESS:  This is a 60 year old male who has a  history of an L-spine fusion by Dr. Marikay Alar in January 2010, who has  been followed for the last 3 weeks for some increasing lower back pain  as well.  He has undergone some plain films as well as CT of his L-spine  today.  By his report, he said that he is going to need a revision of  his fusion at some point.  Since Saturday, he also developed diffuse  abdominal pain that is worse in the right upper quadrant and epigastrium  associated with some nausea, couple episodes of emesis as well as some  sweats and subjective fever at home.  He is passing some flatus and had  a normal bowel movement earlier today.  He came in today just due to  worsening of the pain associated with the sweats as well.  There is no  real relation to eating.  There is no real relieving factors at all  except for the Dilaudid he has received in the emergency room prior to  me evaluated him.   PAST MEDICAL HISTORY:  Significant for hypertension,  hypercholesterolemia, and he also has depression for that he has been  started on medication by his primary care physician since his spine  surgery.   PAST SURGICAL HISTORY:  L-spine fusion in January as well as a right  knee surgery for sounds like what is a Engineer, production cyst.   SOCIAL HISTORY:  He is nonsmoker, does not drink alcohol.  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   DRUG ALLERGIES:   PENICILLIN.   MEDICATIONS:  1. Prilosec 20 mg b.i.d.  2. Aspirin 81 mg daily.  3. Cymbalta 60 mg daily which he started about 3-4 weeks ago.  4. Simvastatin 80 mg daily.  5. Lotrel 40 mg daily.  6. Avodart 0.5 mg every other day.   REVIEW OF SYSTEMS:  Otherwise negative.  He denies any history of  jaundice.   PHYSICAL EXAMINATION:  VITAL SIGNS:  Temperature of 98.6, pulse of 94,  respirations 16, sats of 99%, blood pressure 150/89.  GENERAL:  He is a well-appearing male in no distress.  HEENT:  Anicteric sclerae.  NECK:  Supple without adenopathy.  CHEST:  Clear bilaterally.  HEART:  Regular rate and rhythm.  ABDOMEN:  Soft.  Bowel sounds are present.  He is tender primarily in  his right upper quadrant and epigastrium.  He does not have a Murphy  sign present.  There are  no peritoneal signs.  He has well-healed left  flank and back scars and no hernia is noted.  GENITOURINARY:  No inguinal hernia is noted.  EXTREMITIES:  No edema.  He does have pain with a straight leg raise in  his lower back, but appears that his strength is mostly intact.   LABORATORY DATA:  White blood cell count of 17.6, hematocrit of 39.4,  platelets 376 with a left shift with 86 segs.  Sodium 135, potassium  3.8, BUN 13, creatinine 0.71, glucose of 126.  Lipase 14.  Liver  function tests were all normal.  Ultrasound shows gallbladder wall  thickening, pericholecystic fluid, and gallstones.  A CT scan was also  performed prior to the ultrasound which showed suspected small  gallstones and mildly distending gallbladder, and there may be a small  amount of pericholecystic fluid.  This was otherwise normal except for  the changes around L4-L5.  He also has small lower pole left renal  calculus.   IMPRESSION:  1. Cholecystitis.  2. Low back pain, status post fusion.   PLAN:  I think his primary process right now is cholecystitis in his  abdomen, although he reports that Dr. Yetta Barre told him that he will  need a  revision of his spine.  I have discussed this with Dr. Gerlene Fee who let  Dr. Yetta Barre know, he will see him while he is in the hospital.  For now,  we will treat him for cholecystitis with admission IV fluids, n.p.o.  We  will begin him on Unasyn 3 g every 6 hours.  He and I discussed  laparoscopic possible open cholecystectomy and I also discussed this  would likely be with Dr. Ezzard Standing, my partner who will be taking over  tomorrow. .  We will check his labs in the morning.  We will follow up  with Neurosurgery and then likely plan on taking his gallbladder out  very soon.  He and his wife were both present for this conversation and  I have answered all of their questions.      Juanetta Gosling, MD  Electronically Signed     MCW/MEDQ  D:  05/15/2009  T:  05/16/2009  Job:  [id number]

## 2011-04-16 NOTE — Op Note (Signed)
Scott Trevino, EDMONDSON                 ACCOUNT NO.:  [id number]   MEDICAL RECORD NO.:  [id number]          PATIENT TYPE:  INP   LOCATION:  5127                         FACILITY:  [facility]   PHYSICIAN:  Sandria Bales. Ezzard Standing, M.D.  DATE OF BIRTH:  [birth year]   DATE OF PROCEDURE:  05/16/2009  DATE OF DISCHARGE:                               OPERATIVE REPORT   Date of Surgery - 16 May 2009   PREOPERATIVE DIAGNOSES:  Acute cholecystitis, cholelithiasis.   POSTOPERATIVE DIAGNOSES:  Acute and chronic cholecystitis,  cholelithiasis.   PROCEDURE:  Laparoscopic cholecystectomy with intraoperative  cholangiogram.   SURGEON:  Sandria Bales. Ezzard Standing, MD   FIRST ASSISTANT:  Gabrielle Dare. [name], MD   ANESTHESIA:  General endotracheal.   ESTIMATED BLOOD LOSS:  Minimal.   INDICATIONS FOR PROCEDURE:  Scott Trevino is a 60 year old white male who is  a patient of Dr. Aram Beecham at the [facility] in [place] who  presented last evening and was admitted by Dr. Harden Mo for  cholecystitis.   I carried out discussion with the patient about proceeding with a  gallbladder surgery.  I discussed the indications and potential  complications.  The potential complications of gallbladder surgery  include, but are not limited to, bleeding, infection, bile duct injury,  and the possibility of open surgery.   OPERATIVE NOTE:  The patient was placed in supine position, given a  general endotracheal anesthetic.  He is already on Cipro as antibiotic.  He had his abdomen prepped with ChloraPrep and sterilely draped, and a  time-out was held identifying the patient and procedure.   I accessed the abdominal cavity through an infraumbilical incision with  sharp dissection carried down to the abdominal cavity.  A 0-degree 10-mm  laparoscope was inserted through a 12-mm Hasson trocar.  The Hasson  trocar was secured with a 0-Vicryl suture.  There was a 11-mm subxiphoid  trocar, placed a 5 mm mid subcostal, 5-mm  lateral subcostal trocar.   Gallbladder was noted be acutely distended and edematous.  I had to  decompress the gallbladder with a decompressive needle and aspirated  white bile out of the gallbladder.   I then dissected down to the base of the gallbladder and identified the  gallbladder cystic duct junction.  I identified the cystic artery, which  I doubly clipped and divided.  I then was able to place a clip on the  gallbladder side of the cystic duct, and I shot an intraoperative  cholangiogram.   The intraoperative cholangiogram was shot using cutoff Taut catheter  inserted through a 14-gauge Jelco catheter.  I used about 8 mL of  Omnipaque under fluoroscopy.  This showed free flow of contrast down the  common bile duct up the hepatic radicals down into the duodenum.  Interestingly, it looks like the patient has a duodenal diverticula as  the dye pooled right around the orifice of the of the ampulla of Vater.   I then removed the Taut catheter.  I triply clipped the cystic duct and  divided the cystic duct and then sharply dissected the gallbladder  from  the gallbladder bed.  The gallbladder was noted to be fairly edematous,  but also somewhat friable in dissecting it out.   After complete division of the gallbladder from the gallbladder bed, I  placed an EndoCatch bag, I then lifted up the liver, reinspected the  triangle of Calot, I revisualized the gallbladder bed.  There was no  bleeding, no bile leak.  I irrigated the abdomen out with a liter of  saline.  I pulled the gallbladder out without difficulty.  I closed the  umbilical port with a 0-Vicryl suture and closed the skin edge site with  a 5-0 Monocryl suture, I painted the wound with Dermabond.   The patient tolerated the procedure well, was transported to recovery  room in good condition.  Sponge and needle counts were correct at the  end of the case.      Sandria Bales. Ezzard Standing, M.D.  Electronically Signed      DHN/MEDQ  D:  05/16/2009  T:  05/17/2009  Job:  [id number]   cc:   Aram Beecham, MD  Tia Alert, MD

## 2011-09-03 ENCOUNTER — Other Ambulatory Visit (HOSPITAL_COMMUNITY): Payer: Self-pay | Admitting: Neurosurgery

## 2011-09-03 DIAGNOSIS — M545 Low back pain: Secondary | ICD-10-CM

## 2011-10-04 ENCOUNTER — Ambulatory Visit (HOSPITAL_COMMUNITY)
Admission: RE | Admit: 2011-10-04 | Discharge: 2011-10-04 | Disposition: A | Discharge status: Home / Self Care | Payer: BC Managed Care – PPO | Source: Ambulatory Visit | Attending: Neurosurgery | Admitting: Neurosurgery

## 2011-10-04 DIAGNOSIS — M545 Low back pain, unspecified: Secondary | ICD-10-CM | POA: Insufficient documentation

## 2011-10-04 DIAGNOSIS — M48061 Spinal stenosis, lumbar region without neurogenic claudication: Secondary | ICD-10-CM | POA: Insufficient documentation

## 2011-10-04 DIAGNOSIS — Z981 Arthrodesis status: Secondary | ICD-10-CM | POA: Insufficient documentation

## 2011-10-04 DIAGNOSIS — M5126 Other intervertebral disc displacement, lumbar region: Secondary | ICD-10-CM | POA: Insufficient documentation

## 2011-10-04 DIAGNOSIS — M169 Osteoarthritis of hip, unspecified: Secondary | ICD-10-CM | POA: Insufficient documentation

## 2011-10-04 DIAGNOSIS — M519 Unspecified thoracic, thoracolumbar and lumbosacral intervertebral disc disorder: Secondary | ICD-10-CM | POA: Insufficient documentation

## 2011-10-04 DIAGNOSIS — N200 Calculus of kidney: Secondary | ICD-10-CM | POA: Insufficient documentation

## 2011-10-04 DIAGNOSIS — M1610 Unilateral primary osteoarthritis, unspecified hip: Secondary | ICD-10-CM | POA: Insufficient documentation

## 2011-10-04 MED ORDER — IOHEXOL 180 MG/ML  SOLN
20.0000 mL | Freq: Once | INTRAMUSCULAR | Status: AC | PRN
  Administered 2011-10-04: 20 mL via INTRATHECAL

## 2011-10-18 ENCOUNTER — Other Ambulatory Visit: Payer: Self-pay | Admitting: Neurosurgery

## 2011-10-21 ENCOUNTER — Encounter (HOSPITAL_COMMUNITY): Payer: Self-pay | Admitting: Pharmacy Technician

## 2011-10-29 ENCOUNTER — Encounter (HOSPITAL_COMMUNITY)
Admission: RE | Admit: 2011-10-29 | Discharge: 2011-10-29 | Disposition: A | Discharge status: Home / Self Care | Payer: BC Managed Care – PPO | Source: Ambulatory Visit | Attending: Neurosurgery | Admitting: Neurosurgery

## 2011-10-29 ENCOUNTER — Encounter (HOSPITAL_COMMUNITY): Payer: Self-pay

## 2011-10-29 ENCOUNTER — Other Ambulatory Visit: Payer: Self-pay

## 2011-10-29 DIAGNOSIS — Z01818 Encounter for other preprocedural examination: Secondary | ICD-10-CM | POA: Insufficient documentation

## 2011-10-29 DIAGNOSIS — Z01810 Encounter for preprocedural cardiovascular examination: Secondary | ICD-10-CM | POA: Insufficient documentation

## 2011-10-29 DIAGNOSIS — Z01812 Encounter for preprocedural laboratory examination: Secondary | ICD-10-CM | POA: Insufficient documentation

## 2011-10-29 HISTORY — DX: Pure hypercholesterolemia, unspecified: E78.00

## 2011-10-29 HISTORY — DX: Dorsalgia, unspecified: M54.9

## 2011-10-29 HISTORY — DX: Radiculopathy, lumbar region: M54.16

## 2011-10-29 HISTORY — DX: Essential (primary) hypertension: I10

## 2011-10-29 HISTORY — DX: Other chronic pain: G89.29

## 2011-10-29 HISTORY — DX: Gastro-esophageal reflux disease without esophagitis: K21.9

## 2011-10-29 HISTORY — DX: Elevated prostate specific antigen (PSA): R97.20

## 2011-10-29 HISTORY — DX: Low back pain, unspecified: M54.50

## 2011-10-29 HISTORY — DX: Low back pain: M54.5

## 2011-10-29 HISTORY — DX: Major depressive disorder, single episode, unspecified: F32.9

## 2011-10-29 HISTORY — DX: Spinal stenosis, lumbar region without neurogenic claudication: M48.061

## 2011-10-29 HISTORY — DX: Unspecified osteoarthritis, unspecified site: M19.90

## 2011-10-29 HISTORY — DX: Depression, unspecified: F32.A

## 2011-10-29 LAB — CBC
HCT: 42 % (ref 39.0–52.0)
Hemoglobin: 13.4 g/dL (ref 13.0–17.0)
MCV: 90.7 fL (ref 78.0–100.0)
RBC: 4.63 MIL/uL (ref 4.22–5.81)
RDW: 13.7 % (ref 11.5–15.5)
WBC: 7.2 10*3/uL (ref 4.0–10.5)

## 2011-10-29 LAB — BASIC METABOLIC PANEL
CO2: 29 mEq/L (ref 19–32)
Chloride: 101 mEq/L (ref 96–112)
Creatinine, Ser: 0.86 mg/dL (ref 0.50–1.35)
GFR calc Af Amer: >90 mL/min (ref >90)
Potassium: 4.8 mEq/L (ref 3.5–5.1)

## 2011-10-29 LAB — TYPE AND SCREEN: Antibody Screen: NEGATIVE

## 2011-10-29 LAB — SURGICAL PCR SCREEN
MRSA, PCR: NEGATIVE
Staphylococcus aureus: NEGATIVE

## 2011-10-29 NOTE — Pre-Procedure Instructions (Signed)
20 GABE GLACE  10/29/2011   Your procedure is scheduled on:  October 31, 2011  Report to [facility] at 0530 AM.  Call this number if you have problems the morning of surgery: [phone]   Remember:   Do not eat food:After Midnight.  May have clear liquids: up to 4 Hours before arrival. Do not drink anything after 130 am  Clear liquids include soda, tea, black coffee, apple or grape juice, broth.  Take these medicines the morning of surgery with A SIP OF WATER: Norvasc, Cymbalta, Prilosec, Oxycodone,   STOP Aspirin, Multiple- Vitamins today  Do not wear jewelry, make-up or nail polish.  Do not wear lotions, powders, or perfumes. You may wear deodorant.  Do not shave 48 hours prior to surgery.  Do not bring valuables to the hospital.  Contacts, dentures or bridgework may not be worn into surgery.  Leave suitcase in the car. After surgery it may be brought to your room.  For patients admitted to the hospital, checkout time is 11:00 AM the day of discharge.   Special Instructions: CHG Shower Use Special Wash: 1/2 bottle night before surgery and 1/2 bottle morning of surgery.   Please read over the following fact sheets that you were given: Pain Booklet, Coughing and Deep Breathing, Blood Transfusion Information and Surgical Site Infection Prevention

## 2011-10-29 NOTE — Progress Notes (Signed)
Requested CXR from Dr. Judithann Sheen

## 2011-10-31 ENCOUNTER — Encounter (HOSPITAL_COMMUNITY): Admission: RE | Payer: Self-pay | Source: Ambulatory Visit

## 2011-10-31 ENCOUNTER — Inpatient Hospital Stay (HOSPITAL_COMMUNITY): Admission: RE | Admit: 2011-10-31 | Payer: BC Managed Care – PPO | Source: Ambulatory Visit | Admitting: Neurosurgery

## 2011-10-31 SURGERY — POSTERIOR LUMBAR FUSION 1 LEVEL
Anesthesia: General

## 2011-11-04 ENCOUNTER — Encounter: Payer: Self-pay | Admitting: Neurosurgery

## 2011-11-19 ENCOUNTER — Other Ambulatory Visit: Payer: Self-pay | Admitting: Neurosurgery

## 2011-11-28 ENCOUNTER — Encounter (HOSPITAL_COMMUNITY): Payer: Self-pay | Admitting: Pharmacy Technician

## 2011-12-05 NOTE — Pre-Procedure Instructions (Signed)
20 Scott Trevino  12/05/2011   Your procedure is scheduled on:  Wed,Jan 9 @ 0830  Report to [facility] at 0630 AM.  Call this number if you have problems the morning of surgery: [phone]   Remember:   Do not eat food:After Midnight.  May have clear liquids: up to 4 Hours before arrival.(until 2:30 am)  Clear liquids include soda, tea, black coffee, apple or grape juice, broth.  Take these medicines the morning of surgery with A SIP OF WATER: Amlodipine,Cymbalta,Prilosec,Flomax,Pain Pill(if needed)   Do not wear jewelry, make-up or nail polish.  Do not wear lotions, powders, or perfumes. You may wear deodorant.  Do not shave 48 hours prior to surgery.  Do not bring valuables to the hospital.  Contacts, dentures or bridgework may not be worn into surgery.  Leave suitcase in the car. After surgery it may be brought to your room.  For patients admitted to the hospital, checkout time is 11:00 AM the day of discharge.   Patients discharged the day of surgery will not be allowed to drive home.  Name and phone number of your driver:   Special Instructions: CHG Shower Use Special Wash: 1/2 bottle night before surgery and 1/2 bottle morning of surgery.   Please read over the following fact sheets that you were given: Pain Booklet, Coughing and Deep Breathing, Blood Transfusion Information, MRSA Information and Surgical Site Infection Prevention

## 2011-12-06 ENCOUNTER — Encounter (HOSPITAL_COMMUNITY)
Admission: RE | Admit: 2011-12-06 | Discharge: 2011-12-06 | Disposition: A | Discharge status: Home / Self Care | Payer: BC Managed Care – PPO | Source: Ambulatory Visit | Attending: Neurosurgery | Admitting: Neurosurgery

## 2011-12-06 ENCOUNTER — Encounter (HOSPITAL_COMMUNITY): Payer: Self-pay

## 2011-12-06 HISTORY — DX: Benign prostatic hyperplasia without lower urinary tract symptoms: N40.0

## 2011-12-06 HISTORY — DX: Reserved for concepts with insufficient information to code with codable children: IMO0002

## 2011-12-06 HISTORY — DX: Unspecified fracture of skull, initial encounter for closed fracture: S02.91XA

## 2011-12-06 HISTORY — DX: Urgency of urination: R39.15

## 2011-12-06 LAB — SURGICAL PCR SCREEN
MRSA, PCR: NEGATIVE
Staphylococcus aureus: NEGATIVE

## 2011-12-06 LAB — CBC
MCH: 29.9 pg (ref 26.0–34.0)
MCHC: 33.2 g/dL (ref 30.0–36.0)
RDW: 13.6 % (ref 11.5–15.5)

## 2011-12-06 LAB — BASIC METABOLIC PANEL
BUN: 14 mg/dL (ref 6–23)
Creatinine, Ser: 0.9 mg/dL (ref 0.50–1.35)
GFR calc Af Amer: >90 mL/min (ref >90)
GFR calc non Af Amer: >90 mL/min (ref >90)
Glucose, Bld: 106 mg/dL — ABNORMAL HIGH (ref 70–99)

## 2011-12-06 NOTE — Progress Notes (Signed)
Pt doesn't have a cardiologist;maintained by medical MD for HTN/hypercholesterol;[facility]-DrJeffrey Sparks  Stress test 12+yrs ago Echo 12+yrs ago

## 2011-12-06 NOTE — Progress Notes (Signed)
Pt states that after back surgery in 2010 he had a PICC and received Vanc for 6wks;was never told that it was MRSA or staph but he did have an infection

## 2011-12-09 NOTE — Consult Note (Signed)
Anesthesia:  Patient is a 61 year old male scheduled for a exploration of lumbar fusion with L3-4 laminectomy and PLIF on 12/11/11.  His history is significant for GERD, BPH,  hypercholesterolemia, chronic back pain, depression, HTN, and former smoker.    His EKG from 10/29/11 showed NSR.  Labs acceptable.  I was asked to review his CXR done at [facility] on 02/11/11 that showed findings suggestive of linear ATX at the right lung base in the RML.  There was no definite edema, infiltrate, effusion, or PTX.  Heart size was normal.  (Of note, his last CXR at [facility] on 08/17/09 was also noted to have "Linear scarring in the right middle lobe.")  Clinical correlation on the day of surgery.  If he is not having any worrisome respiratory s/s, then anticipate that he can proceed without additional testing.

## 2011-12-10 MED ORDER — VANCOMYCIN HCL IN DEXTROSE 1-5 GM/200ML-% IV SOLN
1000.0000 mg | INTRAVENOUS | Status: DC
  Filled 2011-12-10: qty 200

## 2011-12-11 ENCOUNTER — Encounter (HOSPITAL_COMMUNITY): Payer: Self-pay | Admitting: *Deleted

## 2011-12-11 ENCOUNTER — Encounter (HOSPITAL_COMMUNITY): Payer: Self-pay | Admitting: Vascular Surgery

## 2011-12-11 ENCOUNTER — Inpatient Hospital Stay (HOSPITAL_COMMUNITY)
Admission: RE | Admit: 2011-12-11 | Discharge: 2011-12-14 | DRG: 756 | Disposition: A | Discharge status: Home / Self Care | Payer: BC Managed Care – PPO | Source: Ambulatory Visit | Attending: Neurosurgery | Admitting: Neurosurgery

## 2011-12-11 ENCOUNTER — Encounter (HOSPITAL_COMMUNITY)
Admission: RE | Disposition: A | Discharge status: Home / Self Care | Payer: Self-pay | Source: Ambulatory Visit | Attending: Neurosurgery

## 2011-12-11 ENCOUNTER — Ambulatory Visit (HOSPITAL_COMMUNITY): Payer: BC Managed Care – PPO | Admitting: Vascular Surgery

## 2011-12-11 ENCOUNTER — Ambulatory Visit (HOSPITAL_COMMUNITY): Payer: BC Managed Care – PPO

## 2011-12-11 DIAGNOSIS — I10 Essential (primary) hypertension: Secondary | ICD-10-CM | POA: Diagnosis present

## 2011-12-11 DIAGNOSIS — K219 Gastro-esophageal reflux disease without esophagitis: Secondary | ICD-10-CM | POA: Diagnosis present

## 2011-12-11 DIAGNOSIS — Z6831 Body mass index (BMI) 31.0-31.9, adult: Secondary | ICD-10-CM

## 2011-12-11 DIAGNOSIS — M129 Arthropathy, unspecified: Secondary | ICD-10-CM | POA: Diagnosis present

## 2011-12-11 DIAGNOSIS — G8929 Other chronic pain: Secondary | ICD-10-CM | POA: Diagnosis present

## 2011-12-11 DIAGNOSIS — Z01812 Encounter for preprocedural laboratory examination: Secondary | ICD-10-CM

## 2011-12-11 DIAGNOSIS — M5137 Other intervertebral disc degeneration, lumbosacral region: Principal | ICD-10-CM | POA: Diagnosis present

## 2011-12-11 DIAGNOSIS — Z23 Encounter for immunization: Secondary | ICD-10-CM

## 2011-12-11 DIAGNOSIS — F329 Major depressive disorder, single episode, unspecified: Secondary | ICD-10-CM | POA: Diagnosis present

## 2011-12-11 DIAGNOSIS — E7800 Pure hypercholesterolemia, unspecified: Secondary | ICD-10-CM | POA: Diagnosis present

## 2011-12-11 DIAGNOSIS — F3289 Other specified depressive episodes: Secondary | ICD-10-CM | POA: Diagnosis present

## 2011-12-11 DIAGNOSIS — R5082 Postprocedural fever: Secondary | ICD-10-CM | POA: Diagnosis not present

## 2011-12-11 DIAGNOSIS — M51379 Other intervertebral disc degeneration, lumbosacral region without mention of lumbar back pain or lower extremity pain: Principal | ICD-10-CM | POA: Diagnosis present

## 2011-12-11 DIAGNOSIS — N400 Enlarged prostate without lower urinary tract symptoms: Secondary | ICD-10-CM | POA: Diagnosis present

## 2011-12-11 SURGERY — POSTERIOR LUMBAR FUSION 1 WITH HARDWARE REMOVAL
Anesthesia: General | Site: Spine Lumbar | Laterality: Bilateral | Wound class: Clean

## 2011-12-11 MED ORDER — MORPHINE SULFATE (PF) 1 MG/ML IV SOLN
INTRAVENOUS | Status: DC
  Administered 2011-12-11: 8.01 mg via INTRAVENOUS
  Administered 2011-12-11: via INTRAVENOUS
  Administered 2011-12-11: 1.5 mg via INTRAVENOUS
  Administered 2011-12-11: 7.5 mg via INTRAVENOUS
  Administered 2011-12-11: 16:00:00 via INTRAVENOUS
  Administered 2011-12-12: 1.5 mg via INTRAVENOUS
  Administered 2011-12-12: 7.5 mg via INTRAVENOUS
  Filled 2011-12-11 (×4): qty 25

## 2011-12-11 MED ORDER — SODIUM CHLORIDE 0.9 % IV SOLN
INTRAVENOUS | Status: AC
  Filled 2011-12-11: qty 500

## 2011-12-11 MED ORDER — PROPOFOL 10 MG/ML IV EMUL
INTRAVENOUS | Status: DC | PRN
  Administered 2011-12-11: 200 mg via INTRAVENOUS

## 2011-12-11 MED ORDER — SODIUM CHLORIDE 0.9 % IJ SOLN: 3.0000 mL | INTRAMUSCULAR | Status: DC | PRN

## 2011-12-11 MED ORDER — PHENYLEPHRINE HCL 10 MG/ML IJ SOLN
INTRAMUSCULAR | Status: DC | PRN
  Administered 2011-12-11: 40 ug via INTRAVENOUS
  Administered 2011-12-11 (×2): 80 ug via INTRAVENOUS

## 2011-12-11 MED ORDER — AMLODIPINE BESYLATE 5 MG PO TABS
5.0000 mg | ORAL_TABLET | ORAL | Status: DC
  Administered 2011-12-12 – 2011-12-14 (×3): 5 mg via ORAL
  Filled 2011-12-11 (×5): qty 1

## 2011-12-11 MED ORDER — BUPIVACAINE LIPOSOME 1.3 % IJ SUSP
INTRAMUSCULAR | Status: DC | PRN
  Administered 2011-12-11: 20 mL

## 2011-12-11 MED ORDER — GLYCOPYRROLATE 0.2 MG/ML IJ SOLN
INTRAMUSCULAR | Status: DC | PRN
  Administered 2011-12-11: .6 mg via INTRAVENOUS

## 2011-12-11 MED ORDER — NALOXONE HCL 0.4 MG/ML IJ SOLN
0.4000 mg | INTRAMUSCULAR | Status: DC | PRN
  Filled 2011-12-11: qty 1

## 2011-12-11 MED ORDER — 0.9 % SODIUM CHLORIDE (POUR BTL) OPTIME
TOPICAL | Status: DC | PRN
  Administered 2011-12-11: 1000 mL

## 2011-12-11 MED ORDER — PANTOPRAZOLE SODIUM 40 MG PO TBEC
40.0000 mg | DELAYED_RELEASE_TABLET | Freq: Every day | ORAL | Status: DC
  Administered 2011-12-12 – 2011-12-13 (×2): 40 mg via ORAL
  Filled 2011-12-11 (×3): qty 1

## 2011-12-11 MED ORDER — VANCOMYCIN HCL IN DEXTROSE 1-5 GM/200ML-% IV SOLN
1000.0000 mg | INTRAVENOUS | Status: AC
  Administered 2011-12-11: 1000 mg via INTRAVENOUS

## 2011-12-11 MED ORDER — DULOXETINE HCL 60 MG PO CPEP
60.0000 mg | ORAL_CAPSULE | ORAL | Status: DC
  Administered 2011-12-12 – 2011-12-14 (×3): 60 mg via ORAL
  Filled 2011-12-11 (×5): qty 1

## 2011-12-11 MED ORDER — TAMSULOSIN HCL 0.4 MG PO CAPS
0.4000 mg | ORAL_CAPSULE | Freq: Every day | ORAL | Status: DC
  Administered 2011-12-12 – 2011-12-14 (×3): 0.4 mg via ORAL
  Filled 2011-12-11 (×4): qty 1

## 2011-12-11 MED ORDER — HYDROMORPHONE HCL PF 1 MG/ML IJ SOLN
0.2500 mg | INTRAMUSCULAR | Status: DC | PRN
  Administered 2011-12-11 (×2): 0.5 mg via INTRAVENOUS

## 2011-12-11 MED ORDER — THERA M PLUS PO TABS
1.0000 | ORAL_TABLET | ORAL | Status: DC
  Administered 2011-12-12 – 2011-12-13 (×2): 1 via ORAL
  Filled 2011-12-11 (×5): qty 1

## 2011-12-11 MED ORDER — BENAZEPRIL HCL 40 MG PO TABS
40.0000 mg | ORAL_TABLET | ORAL | Status: DC
  Administered 2011-12-12 – 2011-12-14 (×3): 40 mg via ORAL
  Filled 2011-12-11 (×5): qty 1

## 2011-12-11 MED ORDER — DOCUSATE SODIUM 100 MG PO CAPS
100.0000 mg | ORAL_CAPSULE | Freq: Two times a day (BID) | ORAL | Status: DC
  Administered 2011-12-11 – 2011-12-14 (×6): 100 mg via ORAL
  Filled 2011-12-11 (×3): qty 1

## 2011-12-11 MED ORDER — BACITRACIN ZINC 500 UNIT/GM EX OINT
TOPICAL_OINTMENT | CUTANEOUS | Status: DC | PRN
  Administered 2011-12-11: 1 via TOPICAL

## 2011-12-11 MED ORDER — ACETAMINOPHEN 650 MG RE SUPP: 650.0000 mg | RECTAL | Status: DC | PRN

## 2011-12-11 MED ORDER — ACETAMINOPHEN 325 MG PO TABS
650.0000 mg | ORAL_TABLET | ORAL | Status: DC | PRN
  Administered 2011-12-12: 650 mg via ORAL
  Filled 2011-12-11: qty 1

## 2011-12-11 MED ORDER — BACITRACIN 50000 UNITS IM SOLR
INTRAMUSCULAR | Status: AC
  Filled 2011-12-11: qty 50000

## 2011-12-11 MED ORDER — ROCURONIUM BROMIDE 100 MG/10ML IV SOLN
INTRAVENOUS | Status: DC | PRN
  Administered 2011-12-11: 10 mg via INTRAVENOUS
  Administered 2011-12-11: 50 mg via INTRAVENOUS
  Administered 2011-12-11 (×2): 10 mg via INTRAVENOUS

## 2011-12-11 MED ORDER — PHENOL 1.4 % MT LIQD
1.0000 | OROMUCOSAL | Status: DC | PRN
  Filled 2011-12-11: qty 177

## 2011-12-11 MED ORDER — DIPHENHYDRAMINE HCL 50 MG/ML IJ SOLN
12.5000 mg | Freq: Four times a day (QID) | INTRAMUSCULAR | Status: DC | PRN
  Filled 2011-12-11: qty 0.25

## 2011-12-11 MED ORDER — OXYCODONE-ACETAMINOPHEN 5-325 MG PO TABS
1.0000 | ORAL_TABLET | ORAL | Status: DC | PRN
  Administered 2011-12-12 – 2011-12-14 (×11): 2 via ORAL
  Filled 2011-12-11 (×11): qty 2

## 2011-12-11 MED ORDER — ONDANSETRON HCL 4 MG/2ML IJ SOLN
INTRAMUSCULAR | Status: DC | PRN
  Administered 2011-12-11: 4 mg via INTRAVENOUS

## 2011-12-11 MED ORDER — HETASTARCH-ELECTROLYTES 6 % IV SOLN
INTRAVENOUS | Status: DC | PRN
  Administered 2011-12-11: 10:00:00 via INTRAVENOUS

## 2011-12-11 MED ORDER — PHENOL 1.4 % MT LIQD: 1.0000 | OROMUCOSAL | Status: DC | PRN

## 2011-12-11 MED ORDER — SODIUM CHLORIDE 0.9 % IJ SOLN
3.0000 mL | Freq: Two times a day (BID) | INTRAMUSCULAR | Status: DC
  Administered 2011-12-12 – 2011-12-13 (×4): 3 mL via INTRAVENOUS

## 2011-12-11 MED ORDER — ONDANSETRON HCL 4 MG/2ML IJ SOLN: 4.0000 mg | INTRAMUSCULAR | Status: DC | PRN

## 2011-12-11 MED ORDER — ACETAMINOPHEN 325 MG PO TABS: 650.0000 mg | ORAL_TABLET | ORAL | Status: DC | PRN

## 2011-12-11 MED ORDER — SODIUM CHLORIDE 0.9 % IV SOLN
10.0000 mg | INTRAVENOUS | Status: DC | PRN
  Administered 2011-12-11: 10 ug via INTRAVENOUS

## 2011-12-11 MED ORDER — HYDROMORPHONE HCL PF 1 MG/ML IJ SOLN
INTRAMUSCULAR | Status: AC
  Filled 2011-12-11: qty 1

## 2011-12-11 MED ORDER — LACTATED RINGERS IV SOLN
INTRAVENOUS | Status: DC | PRN
  Administered 2011-12-11 (×4): via INTRAVENOUS

## 2011-12-11 MED ORDER — MENTHOL 3 MG MT LOZG
1.0000 | LOZENGE | OROMUCOSAL | Status: DC | PRN
  Filled 2011-12-11 (×2): qty 9

## 2011-12-11 MED ORDER — ONDANSETRON HCL 4 MG/2ML IJ SOLN
4.0000 mg | INTRAMUSCULAR | Status: DC | PRN
  Administered 2011-12-12 (×2): 4 mg via INTRAVENOUS
  Filled 2011-12-11: qty 2

## 2011-12-11 MED ORDER — DIAZEPAM 5 MG PO TABS
5.0000 mg | ORAL_TABLET | Freq: Four times a day (QID) | ORAL | Status: DC | PRN
  Administered 2011-12-11 – 2011-12-12 (×2): 5 mg via ORAL
  Filled 2011-12-11 (×2): qty 1

## 2011-12-11 MED ORDER — ONDANSETRON HCL 4 MG/2ML IJ SOLN
4.0000 mg | Freq: Four times a day (QID) | INTRAMUSCULAR | Status: DC | PRN
  Filled 2011-12-11 (×2): qty 2

## 2011-12-11 MED ORDER — MIDAZOLAM HCL 5 MG/5ML IJ SOLN
INTRAMUSCULAR | Status: DC | PRN
  Administered 2011-12-11: 2 mg via INTRAVENOUS

## 2011-12-11 MED ORDER — SODIUM CHLORIDE 0.9 % IJ SOLN: 9.0000 mL | INTRAMUSCULAR | Status: DC | PRN

## 2011-12-11 MED ORDER — DIPHENHYDRAMINE HCL 12.5 MG/5ML PO ELIX
12.5000 mg | ORAL_SOLUTION | Freq: Four times a day (QID) | ORAL | Status: DC | PRN
  Filled 2011-12-11: qty 5

## 2011-12-11 MED ORDER — BACITRACIN 50000 UNITS IM SOLR
INTRAMUSCULAR | Status: DC | PRN
  Administered 2011-12-11: 09:00:00

## 2011-12-11 MED ORDER — BUPIVACAINE LIPOSOME 1.3 % IJ SUSP
20.0000 mL | INTRAMUSCULAR | Status: AC
  Filled 2011-12-11: qty 20

## 2011-12-11 MED ORDER — NEOSTIGMINE METHYLSULFATE 1 MG/ML IJ SOLN
INTRAMUSCULAR | Status: DC | PRN
  Administered 2011-12-11: 3 mg via INTRAVENOUS

## 2011-12-11 MED ORDER — ROSUVASTATIN CALCIUM 20 MG PO TABS
20.0000 mg | ORAL_TABLET | Freq: Every day | ORAL | Status: DC
  Administered 2011-12-12 – 2011-12-14 (×3): 20 mg via ORAL
  Filled 2011-12-11 (×4): qty 1

## 2011-12-11 MED ORDER — SODIUM CHLORIDE 0.9 % IV SOLN: 250.0000 mL | INTRAVENOUS | Status: DC

## 2011-12-11 MED ORDER — DROPERIDOL 2.5 MG/ML IJ SOLN: 0.6250 mg | INTRAMUSCULAR | Status: DC | PRN

## 2011-12-11 MED ORDER — SODIUM CHLORIDE 0.9 % IV SOLN
1500.0000 mg | Freq: Once | INTRAVENOUS | Status: AC
  Administered 2011-12-11: 1500 mg via INTRAVENOUS
  Filled 2011-12-11: qty 1500

## 2011-12-11 MED ORDER — THROMBIN 20000 UNITS EX KIT
PACK | OROMUCOSAL | Status: DC | PRN
  Administered 2011-12-11: 09:00:00 via TOPICAL

## 2011-12-11 MED ORDER — ZOLPIDEM TARTRATE 10 MG PO TABS: 10.0000 mg | ORAL_TABLET | Freq: Every evening | ORAL | Status: DC | PRN

## 2011-12-11 MED ORDER — BUPIVACAINE-EPINEPHRINE PF 0.5-1:200000 % IJ SOLN
INTRAMUSCULAR | Status: DC | PRN
  Administered 2011-12-11: 10 mL

## 2011-12-11 MED ORDER — LACTATED RINGERS IV SOLN
INTRAVENOUS | Status: DC
  Administered 2011-12-11: 19:00:00 via INTRAVENOUS

## 2011-12-11 MED ORDER — FENTANYL CITRATE 0.05 MG/ML IJ SOLN
INTRAMUSCULAR | Status: DC | PRN
  Administered 2011-12-11: 100 ug via INTRAVENOUS
  Administered 2011-12-11: 50 ug via INTRAVENOUS
  Administered 2011-12-11: 100 ug via INTRAVENOUS
  Administered 2011-12-11 (×2): 50 ug via INTRAVENOUS
  Administered 2011-12-11: 100 ug via INTRAVENOUS

## 2011-12-11 MED ORDER — SUCCINYLCHOLINE CHLORIDE 20 MG/ML IJ SOLN
INTRAMUSCULAR | Status: DC | PRN
  Administered 2011-12-11: 100 mg via INTRAVENOUS

## 2011-12-11 MED ORDER — MENTHOL 3 MG MT LOZG: 1.0000 | LOZENGE | OROMUCOSAL | Status: DC | PRN

## 2011-12-11 SURGICAL SUPPLY — 68 items
BAG DECANTER FOR FLEXI CONT (MISCELLANEOUS) ×2 IMPLANT
BENZOIN TINCTURE PRP APPL 2/3 (GAUZE/BANDAGES/DRESSINGS) ×2 IMPLANT
BLADE SURG ROTATE 9660 (MISCELLANEOUS) ×2 IMPLANT
BRUSH SCRUB EZ PLAIN DRY (MISCELLANEOUS) ×2 IMPLANT
BUR ACORN 6.0 (BURR) ×2 IMPLANT
BUR MATCHSTICK NEURO 3.0 LAGG (BURR) ×2 IMPLANT
CANISTER SUCTION 2500CC (MISCELLANEOUS) ×2 IMPLANT
CLOTH BEACON ORANGE TIMEOUT ST (SAFETY) ×2 IMPLANT
CONT SPEC 4OZ CLIKSEAL STRL BL (MISCELLANEOUS) ×2 IMPLANT
COVER BACK TABLE 24X17X13 BIG (DRAPES) IMPLANT
DRAPE C-ARM 42X72 X-RAY (DRAPES) ×4 IMPLANT
DRAPE LAPAROTOMY 100X72X124 (DRAPES) ×2 IMPLANT
DRAPE POUCH INSTRU U-SHP 10X18 (DRAPES) ×2 IMPLANT
DRAPE SURG 17X23 STRL (DRAPES) ×8 IMPLANT
ELECT BLADE 4.0 EZ CLEAN MEGAD (MISCELLANEOUS) ×2
ELECT REM PT RETURN 9FT ADLT (ELECTROSURGICAL) ×2
ELECTRODE BLDE 4.0 EZ CLN MEGD (MISCELLANEOUS) ×1 IMPLANT
ELECTRODE REM PT RTRN 9FT ADLT (ELECTROSURGICAL) ×1 IMPLANT
GAUZE SPONGE 4X4 12PLY STRL LF (GAUZE/BANDAGES/DRESSINGS) ×2 IMPLANT
GAUZE SPONGE 4X4 16PLY XRAY LF (GAUZE/BANDAGES/DRESSINGS) ×2 IMPLANT
GLOVE BIO SURGEON STRL SZ8.5 (GLOVE) ×4 IMPLANT
GLOVE BIOGEL PI IND STRL 7.5 (GLOVE) ×1 IMPLANT
GLOVE BIOGEL PI IND STRL 8 (GLOVE) ×1 IMPLANT
GLOVE BIOGEL PI IND STRL 8.5 (GLOVE) ×2 IMPLANT
GLOVE BIOGEL PI INDICATOR 7.5 (GLOVE) ×1
GLOVE BIOGEL PI INDICATOR 8 (GLOVE) ×1
GLOVE BIOGEL PI INDICATOR 8.5 (GLOVE) ×2
GLOVE ECLIPSE 7.5 STRL STRAW (GLOVE) ×4 IMPLANT
GLOVE EXAM NITRILE LRG STRL (GLOVE) IMPLANT
GLOVE EXAM NITRILE MD LF STRL (GLOVE) ×4 IMPLANT
GLOVE EXAM NITRILE XL STR (GLOVE) IMPLANT
GLOVE EXAM NITRILE XS STR PU (GLOVE) IMPLANT
GLOVE OPTIFIT SS 8.0 STRL (GLOVE) ×6 IMPLANT
GLOVE SS BIOGEL STRL SZ 8 (GLOVE) ×2 IMPLANT
GLOVE SUPERSENSE BIOGEL SZ 8 (GLOVE) ×2
GOWN BRE IMP SLV AUR LG STRL (GOWN DISPOSABLE) ×2 IMPLANT
GOWN BRE IMP SLV AUR XL STRL (GOWN DISPOSABLE) ×6 IMPLANT
GOWN STRL REIN 2XL LVL4 (GOWN DISPOSABLE) ×4 IMPLANT
KIT BASIN OR (CUSTOM PROCEDURE TRAY) ×2 IMPLANT
KIT ROOM TURNOVER OR (KITS) ×2 IMPLANT
NEEDLE HYPO 21X1.5 SAFETY (NEEDLE) ×2 IMPLANT
NEEDLE HYPO 22GX1.5 SAFETY (NEEDLE) ×2 IMPLANT
NS IRRIG 1000ML POUR BTL (IV SOLUTION) ×2 IMPLANT
PACK FOAM VITOSS 10CC (Orthopedic Implant) ×2 IMPLANT
PACK LAMINECTOMY NEURO (CUSTOM PROCEDURE TRAY) ×2 IMPLANT
PAD ARMBOARD 7.5X6 YLW CONV (MISCELLANEOUS) ×6 IMPLANT
PATTIES SURGICAL .5 X1 (DISPOSABLE) IMPLANT
PUTTY 10ML ACTIFUSE ABX (Putty) ×2 IMPLANT
ROD REVERE 6.35 CURVED 55MM (Rod) ×4 IMPLANT
SCREW 7.5X50MM (Screw) ×8 IMPLANT
SCREW REVERE 6.35 75X55MM (Screw) ×4 IMPLANT
SPACER SUSTAIN O 10X26 12MM (Spacer) ×4 IMPLANT
SPONGE GAUZE 4X4 12PLY (GAUZE/BANDAGES/DRESSINGS) ×2 IMPLANT
SPONGE LAP 4X18 X RAY DECT (DISPOSABLE) IMPLANT
SPONGE NEURO XRAY DETECT 1X3 (DISPOSABLE) IMPLANT
SPONGE SURGIFOAM ABS GEL 100 (HEMOSTASIS) ×2 IMPLANT
STRIP CLOSURE SKIN 1/2X4 (GAUZE/BANDAGES/DRESSINGS) ×2 IMPLANT
SUT VIC AB 1 CT1 18XBRD ANBCTR (SUTURE) ×2 IMPLANT
SUT VIC AB 1 CT1 8-18 (SUTURE) ×2
SUT VIC AB 2-0 CP2 18 (SUTURE) ×4 IMPLANT
SYR 20CC LL (SYRINGE) ×2 IMPLANT
SYR 20ML ECCENTRIC (SYRINGE) ×4 IMPLANT
TAPE HYPAFIX 4 X10 (GAUZE/BANDAGES/DRESSINGS) ×2 IMPLANT
TOWEL OR 17X24 6PK STRL BLUE (TOWEL DISPOSABLE) ×2 IMPLANT
TOWEL OR 17X26 10 PK STRL BLUE (TOWEL DISPOSABLE) ×2 IMPLANT
TRAY FOLEY CATH 14FRSI W/METER (CATHETERS) ×2 IMPLANT
WATER STERILE IRR 1000ML POUR (IV SOLUTION) ×2 IMPLANT
locking cap ×12 IMPLANT

## 2011-12-11 NOTE — Op Note (Signed)
Brief history: Patient is a 61 year old white male who has undergone a previous L4-5 decompression instrumentation and fusion by another surgeon a few years ago. The patient has developed recurrent back buttocks and leg pain consistent with neurogenic claudication. He failed medical management was worked up with a lumbar MRI. This demonstrated she had spinal stenosis at L3-4. I discussed the various treatment options with the patient including surgery. Patient has weighed the risks, benefits, and alternatives surgery decided proceed with an L3-4 decompression and fusion  Preoperative diagnosis: L3-4 Degenerative disc disease, spinal stenosis; lumbago; lumbar radiculopathy  Postoperative diagnosis: L3-4 Degenerative disc disease, spinal stenosis; lumbago; lumbar radiculopathy  Procedure: Bilateral L3 Laminotomy/foraminotomies to decompress the bilateral L3 as well as L4 nerve roots(the work required to do this was in addition to the work required to do the posterior lumbar interbody fusion because of the patient's spinal stenosis, facet arthropathy. Etc. requiring a wide decompression of the nerve roots.); L3-4 posterior lumbar interbody fusion with local morselized autograft bone and Actifusebone graft extender; insertion of interbody prosthesis at L3-4 (globus peek interbody prosthesis); posterior segmental instrumentation from L3-L5 with globus titanium pedicle screws and rods; posterior lateral arthrodesis at L3-4 with local morselized autograft bone and Vitoss bone graft extender, exploration of lumbar fusion.  Surgeon: Dr. Delma Officer  Asst.: Dr. Shirlean Kelly  Anesthesia: Gen. endotracheal  Estimated blood loss: 300 cc  Drains: None  Locations: None  Description of procedure: The patient was brought to the operating room by the anesthesia team. General endotracheal anesthesia was induced. The patient was turned to the prone position on the Wilson frame. The patient's lumbosacral region  was then prepared with Betadine scrub and Betadine solution. Sterile drapes were applied.  I then injected the area to be incised with Marcaine with epinephrine solution. I then used the scalpel to make a linear midline incision over the 34 and L4-5 interspace. I then used electrocautery to perform a bilateral subperiosteal dissection exposing the spinous process and lamina of 2 to L5. We then obtained intraoperative radiograph to confirm our location. We then inserted the Verstrac retractor to provide exposure.  I began the decompression by using the high speed drill to perform laminotomies at L3. We then used the Kerrison punches to widen the laminotomy and removed the ligamentum flavum at 34 and as well as to remove these cephalad aspect of the L4 lamina. We used the Kerrison punches to remove the medial facets at 34. We performed wide foraminotomies about the bilateral L3 as well as L4 nerve roots completing the decompression.  We now turned our attention to the posterior lumbar interbody fusion. I used a scalpel to incise the intervertebral disc at L3-4. I then performed a partial intervertebral discectomy at L3-4 using the pituitary forceps. We prepared the vertebral endplates at L3-4 for the fusion by removing the soft tissues with the curettes. We then used the trial spacers to pick the appropriate sized interbody prosthesis. We prefilled his prosthesis with a combination of local morselized autograft bone that we obtained during the decompression as well as Actifuse bone graft extender. We inserted the prefilled prosthesis into the interspace at L3-4. There was a good snug fit of the prosthesis in the interspace. We then filled and the remainder of the intervertebral disc space with local morselized autograft bone and Actifuse. This completed the posterior lumbar interbody arthrodesis.  We now turned attention to the instrumentation. Under fluoroscopic guidance we cannulated the bilateral L3  pedicles with the bone probe. We  then removed the bone probe. He then tapped the pedicle with a 6.5 millimeter tap. We then removed the tap. We probed inside the tapped pedicle with a ball probe to rule out cortical breaches. We then inserted a 7.5 x 55 millimeter pedicle screw into the L3 pedicles bilaterally under fluoroscopic guidance. We then palpated along the medial aspect of the pedicles to rule out cortical breaches. There were none. The nerve roots were not injured. We now turned attention to the exploration of the previous lumbar fusion. I removed the caps from the old pedicle screws and then removed the rods. We attempted to independently move the pedicle screws tested fusion. It appeared he had a good fusion. As we do not have any new caps for the old pedicle screws we removed the old pedicle screws at L4 and L5. We replaced them with 7.5 x 50 mg screws seen is same holes at L4 and L5. We then connected the unilateral pedicle screws with a lordotic rod. We compressed the construct and secured the rod in place with the caps. We then tightened the caps appropriately. This completed the instrumentation from L3-L5.  We now turned our attention to the posterior lateral arthrodesis at L3-4. We used the high-speed drill to decorticate the remainder of the facets, pars, transverse process at L3-4. We then applied a combination of local morselized autograft bone and Vitoss bone graft extender over these decorticated posterior lateral structures. This completed the posterior lateral arthrodesis.  We then obtained hemostasis using bipolar electrocautery. We irrigated the wound out with bacitracin solution. We inspected the thecal sac and nerve roots and noted they were well decompressed. We then removed the retractor. We reapproximated patient's thoracolumbar fascia with interrupted #1 Vicryl suture. We reapproximated patient's subcutaneous tissue with interrupted 2-0 Vicryl suture. The reapproximated patient's  skin with Steri-Strips and benzoin. The wound was then coated with bacitracin ointment. A sterile dressing was applied. The drapes were removed. The patient was subsequently returned to the supine position where they were extubated by the anesthesia team. He was then transported to the post anesthesia care unit in stable condition. All sponge instrument and needle counts were correct at the end of this case.

## 2011-12-11 NOTE — Transfer of Care (Signed)
Immediate Anesthesia Transfer of Care Note  Patient: Scott Trevino  Procedure(s) Performed:  POSTERIOR LUMBAR FUSION 1 WITH HARDWARE REMOVAL - exploration of Lumbar fusion with Lumbar three-four laminectomy,  posterior lumbar interbody fusion, interbody prothesis posterolateral and posterior nonsegmental instrumentation  Patient Location: PACU  Anesthesia Type: General  Level of Consciousness: awake and alert   Airway & Oxygen Therapy: Patient Spontanous Breathing and Patient connected to face mask oxygen  Post-op Assessment: Report given to PACU RN and Post -op Vital signs reviewed and stable  Post vital signs: Reviewed and stable  Complications: No apparent anesthesia complications

## 2011-12-11 NOTE — Anesthesia Postprocedure Evaluation (Signed)
Anesthesia Post Note  Patient: Scott Trevino  Procedure(s) Performed:  POSTERIOR LUMBAR FUSION 1 WITH HARDWARE REMOVAL - exploration of Lumbar fusion with Lumbar three-four laminectomy,  posterior lumbar interbody fusion, interbody prothesis posterolateral and posterior nonsegmental instrumentation  Anesthesia type: general  Patient location: PACU  Post pain: Pain level controlled  Post assessment: Patient's Cardiovascular Status Stable  Last Vitals:  Filed Vitals:   12/11/11 1300  BP:   Pulse:   Temp: 36.8 C  Resp:     Post vital signs: Reviewed and stable  Level of consciousness: sedated  Complications: No apparent anesthesia complications

## 2011-12-11 NOTE — Anesthesia Preprocedure Evaluation (Addendum)
Anesthesia Evaluation  Patient identified by MRN, date of birth, ID band Patient awake    Reviewed: Allergy & Precautions, H&P , NPO status , Patient's Chart, lab work & pertinent test results  Airway Mallampati: III TM Distance: >3 FB Neck ROM: Full    Dental  (+) Dental Advisory Given and Chipped   Pulmonary  clear to auscultation  Pulmonary exam normal       Cardiovascular hypertension, Pt. on medications Regular Normal- Systolic murmurs    Neuro/Psych PSYCHIATRIC DISORDERS Depression  Neuromuscular disease    GI/Hepatic Neg liver ROS, GERD-  Controlled and Medicated,  Endo/Other  Negative Endocrine ROS  Renal/GU negative Renal ROS     Musculoskeletal   Abdominal   Peds  Hematology   Anesthesia Other Findings   Reproductive/Obstetrics                         Anesthesia Physical Anesthesia Plan  ASA: II  Anesthesia Plan: General   Post-op Pain Management:    Induction: Intravenous  Airway Management Planned: Oral ETT  Additional Equipment:   Intra-op Plan:   Post-operative Plan: Extubation in OR  Informed Consent: I have reviewed the patients History and Physical, chart, labs and discussed the procedure including the risks, benefits and alternatives for the proposed anesthesia with the patient or authorized representative who has indicated his/her understanding and acceptance.     Plan Discussed with: CRNA, Anesthesiologist and Surgeon  Anesthesia Plan Comments:         Anesthesia Quick Evaluation

## 2011-12-11 NOTE — Preoperative (Signed)
Beta Blockers   Reason not to administer Beta Blockers:Not Applicable 

## 2011-12-11 NOTE — Progress Notes (Signed)
Subjective:  The patient is alert and pleasant. He looks well.  Objective: Vital signs in last 24 hours: Temp:  [97.2 F (36.2 C)-98.2 F (36.8 C)] 98.2 F (36.8 C) (01/09 1300) Pulse Rate:  [80] 80  (01/09 0700) Resp:  [18] 18  (01/09 0700) BP: (121)/(80) 121/80 mmHg (01/09 0700) SpO2:  [96 %] 96 % (01/09 0700)  Intake/Output from previous day:   Intake/Output this shift: Total I/O In: 3600 [I.V.:3100; IV Piggyback:500] Out: 515 [Urine:215; Blood:300]  Physical exam the patient is alert. He is moving all 4 extremities well.  Lab Results: No results found for this basename: WBC:2,HGB:2,HCT:2,PLT:2 in the last 72 hours BMET No results found for this basename: NA:2,K:2,CL:2,CO2:2,GLUCOSE:2,BUN:2,CREATININE:2,CALCIUM:2 in the last 72 hours  Studies/Results: Dg Lumbar Spine 1 View  12/11/2011  *RADIOLOGY REPORT*  Clinical Data: Lumbar stenosis and radiculopathy.  LUMBAR SPINE - 1 VIEW  Comparison: None.  Findings: Intraoperative cross-table lateral view shows pedicle screws and posterior fixation rods in place at L4-5. A probe is seen just superior to this hardware, overlying the interspinous space at the level of L3-4.  IMPRESSION: Intraoperative localization of posterior zL3-4 interspinous space.  Original Report Authenticated By: Danae Orleans, M.D.    Assessment/Plan: The patient is doing well.  LOS: 0 days     Hannalee Castor D 12/11/2011, 1:24 PM

## 2011-12-11 NOTE — Progress Notes (Signed)
Pharmacy Consult/vancomycin   -Wt=119.2kg   -SCr= 0.9 (12/06/11); CrCl~85 61 yo male s/p lumbar decompression/fusion and for 1 dose of vancomycin post op (last vancomycin dose this am at 0845) -Will give vancomycin 1500mg  IV x1 at 9:00 pm tonight  Harland German, Pharm D 12/11/2011 7:07 PM

## 2011-12-11 NOTE — H&P (Signed)
Subjective: Patient is a 61 year old white male who has previously undergone an L4-5 and 51 fusion by another physician years ago. Patient has developed recurrent back buttocks and leg pain consistent with neurogenic claudication. He has failed medical management and was worked up with a lumbar MRI. This demonstrated significant adjacent segment degenerative changes and the possibility of a pseudoarthrosis. I discussed the various treatment options with the patient including surgery. He wants to undergo an exploration of his lumbar fusion with the L3-4 decompression patient fusion after weighing the risks benefits alternatives of surgery.   Past Medical History  Diagnosis Date  . Hypercholesteremia     takes Zocor daily  . Elevated PSA     takes Flomax daily  . Chronic back pain   . Lumbar stenosis   . Lumbar radiculopathy   . Lumbago   . Arthritis   . Fractured skull 1982  . Degenerative disc disease   . GERD (gastroesophageal reflux disease)     takes Omeprazole bid  . Urinary urgency   . Enlarged prostate   . Depression     takes Cymbalta daily  . Hypertension     takes Benazepril and Amlodipine daily    Past Surgical History  Procedure Date  . Knee arthroscopy 10+yrs ago  . Back surgery 2010    x 2  . Cholecystectomy 2010  . Colonoscopy     Allergies  Allergen Reactions  . Penicillins Rash    History  Substance Use Topics  . Smoking status: Former Smoker -- 1.5 packs/day for 20 years    Types: Cigarettes    Quit date: 12/02/1994  . Smokeless tobacco: Never Used  . Alcohol Use: Yes     occasionally    Family History  Problem Relation Age of Onset  . Anesthesia problems Neg Hx   . Hypotension Neg Hx   . Malignant hyperthermia Neg Hx   . Pseudochol deficiency Neg Hx    Prior to Admission medications   Medication Sig Start Date End Date Taking? Authorizing Provider  amLODipine (NORVASC) 5 MG tablet Take 5 mg by mouth every morning.     Yes Historical Provider,  MD  aspirin 81 MG tablet Take 81 mg by mouth every morning. Hold for procedure   Yes Historical Provider, MD  benazepril (LOTENSIN) 40 MG tablet Take 40 mg by mouth every morning.    Yes Historical Provider, MD  cyclobenzaprine (FLEXERIL) 10 MG tablet Take 10 mg by mouth at bedtime.     Yes Historical Provider, MD  diclofenac (VOLTAREN) 75 MG EC tablet Take 75 mg by mouth 2 (two) times daily.     Yes Historical Provider, MD  DULoxetine (CYMBALTA) 60 MG capsule Take 60 mg by mouth every morning.    Yes Historical Provider, MD  HYDROcodone-acetaminophen (VICODIN) 5-500 MG per tablet Take 1-2 tablets by mouth every 6 (six) hours as needed. For pain    Yes Historical Provider, MD  Multiple Vitamins-Minerals (MULTIVITAMINS THER. W/MINERALS) TABS Take 1 tablet by mouth every morning.    Yes Historical Provider, MD  omeprazole (PRILOSEC) 20 MG capsule Take 20 mg by mouth 2 (two) times daily.     Yes Historical Provider, MD  simvastatin (ZOCOR) 80 MG tablet Take 80 mg by mouth at bedtime.     Yes Historical Provider, MD  Tamsulosin HCl (FLOMAX) 0.4 MG CAPS Take 0.4 mg by mouth daily after breakfast.     Yes Historical Provider, MD     Review of Systems  Positive ROS: Negative except as above.  All other systems have been reviewed and were otherwise negative with the exception of those mentioned in the HPI and as above.  Objective: Vital signs in last 24 hours: Temp:  [97.2 F (36.2 C)] 97.2 F (36.2 C) (01/09 0700) Pulse Rate:  [80] 80  (01/09 0700) Resp:  [18] 18  (01/09 0700) BP: (121)/(80) 121/80 mmHg (01/09 0700) SpO2:  [96 %] 96 % (01/09 0700)  General Appearance: Alert, cooperative, no distress, appears stated age Head: Normocephalic, without obvious abnormality, atraumatic Eyes: PERRL, conjunctiva/corneas clear, EOM's intact, fundi benign, both eyes      Ears: Normal TM's and external ear canals, both ears Throat: Lips, mucosa, and tongue normal; teeth and gums normal Neck: Supple,  symmetrical, trachea midline, no adenopathy; thyroid: No enlargement/tenderness/nodules; no carotid bruit or JVD Back: Symmetric, no curvature, ROM normal, no CVA tenderness. His lumbar incision is well-healed. Lungs: Clear to auscultation bilaterally, respirations unlabored Heart: Regular rate and rhythm, S1 and S2 normal, no murmur, rub or gallop Abdomen: Soft, non-tender, bowel sounds active all four quadrants, no masses, no organomegaly Extremities: Extremities normal, atraumatic, no cyanosis or edema Pulses: 2+ and symmetric all extremities Skin: Skin color, texture, turgor normal, no rashes or lesions  NEUROLOGIC:   Mental status: alert and oriented, no aphasia, good attention span, Fund of knowledge/ memory ok Motor Exam - grossly normal Sensory Exam - grossly normal Reflexes:  Coordination - grossly normal Gait - grossly normal Balance - grossly normal Cranial Nerves: I: smell Not tested  II: visual acuity  OS: Normal    OD: Normal   II: visual fields Full to confrontation  II: pupils Equal, round, reactive to light  III,VII: ptosis None  III,IV,VI: extraocular muscles  Full ROM  V: mastication Normal  V: facial light touch sensation  Normal  V,VII: corneal reflex  Present  VII: facial muscle function - upper  Normal  VII: facial muscle function - lower Normal  VIII: hearing Not tested  IX: soft palate elevation  Normal  IX,X: gag reflex Present  XI: trapezius strength  5/5  XI: sternocleidomastoid strength 5/5  XI: neck flexion strength  5/5  XII: tongue strength  Normal    Data Review Lab Results  Component Value Date   WBC 8.5 12/06/2011   HGB 14.3 12/06/2011   HCT 43.1 12/06/2011   MCV 90.0 12/06/2011   PLT 328 12/06/2011   Lab Results  Component Value Date   NA 139 12/06/2011   K 5.0 12/06/2011   CL 101 12/06/2011   CO2 28 12/06/2011   BUN 14 12/06/2011   CREATININE 0.90 12/06/2011   GLUCOSE 106* 12/06/2011   Lab Results  Component Value Date   INR 0.9 08/17/2009     Assessment/Plan: L3-4 degenerative disease, spinal stenosis, neurogenic claudication, lumbago, possible pseudarthrosis: I discussed situation with the patient. I reviewed his imaging studies with him. We have discussed the various treatment options including surgery. I described the procedure of exploration of lumbar fusion with an L3-4 decompression instrumentation and fusion. I've shown him surgical models. We have discussed the risks, benefits, alternatives and likelihood of achieving our goals. I have answered all his questions. He wants to proceed with surgery.   Towanna Avery D 12/11/2011 8:19 AM

## 2011-12-12 ENCOUNTER — Inpatient Hospital Stay (HOSPITAL_COMMUNITY): Payer: BC Managed Care – PPO

## 2011-12-12 LAB — CULTURE, BLOOD (ROUTINE X 2): Culture  Setup Time: 201301110420

## 2011-12-12 LAB — BASIC METABOLIC PANEL
BUN: 13 mg/dL (ref 6–23)
Calcium: 8.8 mg/dL (ref 8.4–10.5)
Creatinine, Ser: 0.82 mg/dL (ref 0.50–1.35)
GFR calc Af Amer: >90 mL/min (ref >90)
GFR calc non Af Amer: >90 mL/min (ref >90)

## 2011-12-12 LAB — URINALYSIS, ROUTINE W REFLEX MICROSCOPIC
Bilirubin Urine: NEGATIVE
Ketones, ur: NEGATIVE mg/dL
Nitrite: NEGATIVE
Protein, ur: NEGATIVE mg/dL

## 2011-12-12 LAB — CBC
MCH: 30.1 pg (ref 26.0–34.0)
Platelets: 290 10*3/uL (ref 150–400)
RBC: 4.09 MIL/uL — ABNORMAL LOW (ref 4.22–5.81)
RDW: 14 % (ref 11.5–15.5)
WBC: 16.6 10*3/uL — ABNORMAL HIGH (ref 4.0–10.5)

## 2011-12-12 LAB — URINE MICROSCOPIC-ADD ON

## 2011-12-12 LAB — URINE CULTURE: Colony Count: NO GROWTH

## 2011-12-12 MED ORDER — MORPHINE SULFATE 2 MG/ML IJ SOLN
2.0000 mg | INTRAMUSCULAR | Status: DC | PRN
  Administered 2011-12-12: 2 mg via INTRAVENOUS
  Filled 2011-12-12: qty 1

## 2011-12-12 NOTE — Progress Notes (Signed)
Patient ID: ROAN MIKLOS, male   DOB: 10-29-1951, 61 y.o.   MRN: [id number] Subjective:  The patient is alert and pleasant. He looks well.  Objective: Vital signs in last 24 hours: Temp:  [97.8 F (36.6 C)-98.7 F (37.1 C)] 98.7 F (37.1 C) (01/10 1021) Pulse Rate:  [95-113] 102  (01/10 1021) Resp:  [9-23] 20  (01/10 1021) BP: (117-169)/(72-96) 117/72 mmHg (01/10 1021) SpO2:  [90 %-99 %] 97 % (01/10 1021) Weight:  [114.306 kg (252 lb)] 114.306 kg (252 lb) (01/10 0631)  Intake/Output from previous day: 01/09 0701 - 01/10 0700 In: 4443.3 [I.V.:3941.3; IV Piggyback:502] Out: 3365 [Urine:3065; Blood:300] Intake/Output this shift:    Physical exam the patient is alert and oriented x3. His motor strength is grossly normal his lower extremities bilaterally. His dressing is clean and dry.  Lab Results:  [facility] 12/12/11 0640  WBC 16.6*  HGB 12.3*  HCT 38.1*  PLT 290   BMET  Basename 12/12/11 0640  NA 137  K 5.0  CL 98  CO2 31  GLUCOSE 147*  BUN 13  CREATININE 0.82  CALCIUM 8.8    Studies/Results: Dg Lumbar Spine 2-3 Views  12/11/2011  *RADIOLOGY REPORT*  Clinical Data: L3-4 posterior fusion.  Removal of old hardware and placement of new hardware at L4-5.  LUMBAR SPINE - 2-3 VIEW  Comparison: 10/04/2011  Findings: Two intraoperative spot images demonstrate posterior pedicle screws at L3, L4 and L5.  Removal of prior hardware at L4- 5.  No complicating feature.  IMPRESSION: Posterior pedicle screws at L3, L4 and L5.  Original Report Authenticated By: Cyndie Chime, M.D.   Dg Lumbar Spine 1 View  12/11/2011  *RADIOLOGY REPORT*  Clinical Data: Lumbar stenosis and radiculopathy.  LUMBAR SPINE - 1 VIEW  Comparison: None.  Findings: Intraoperative cross-table lateral view shows pedicle screws and posterior fixation rods in place at L4-5. A probe is seen just superior to this hardware, overlying the interspinous space at the level of L3-4.  IMPRESSION: Intraoperative localization of  posterior zL3-4 interspinous space.  Original Report Authenticated By: Danae Orleans, M.D.    Assessment/Plan: Postop day #1: Patient is doing well. We will mobilize him with PT and OT.  LOS: 1 day     Camile Esters D 12/12/2011, 10:27 AM

## 2011-12-12 NOTE — Progress Notes (Signed)
Physical Therapy Evaluation Patient Details Name: Scott Trevino MRN: [id number] DOB: 01/24/1951 Today's Date: 12/12/2011  Problem List: There is no problem list on file for this patient.   Past Medical History:  Past Medical History  Diagnosis Date  . Hypercholesteremia     takes Zocor daily  . Elevated PSA     takes Flomax daily  . Chronic back pain   . Lumbar stenosis   . Lumbar radiculopathy   . Lumbago   . Arthritis   . Fractured skull 1982  . Degenerative disc disease   . GERD (gastroesophageal reflux disease)     takes Omeprazole bid  . Urinary urgency   . Enlarged prostate   . Depression     takes Cymbalta daily  . Hypertension     takes Benazepril and Amlodipine daily   Past Surgical History:  Past Surgical History  Procedure Date  . Knee arthroscopy 10+yrs ago  . Back surgery 2010    x 2  . Cholecystectomy 2010  . Colonoscopy     PT Assessment/Plan/Recommendation PT Assessment Clinical Impression Statement: Pt presents with a medical diagnosis of L3-4 laminectomy and fusion. Pt will benefit from skilled PT in the acute care setting in order to maximize functional mobility for a safe d/c home PT Recommendation/Assessment: Patient will need skilled PT in the acute care venue PT Problem List: Decreased activity tolerance;Decreased mobility;Decreased knowledge of use of DME;Decreased knowledge of precautions;Pain PT Therapy Diagnosis : Acute pain PT Plan PT Frequency: Min 5X/week PT Treatment/Interventions: DME instruction;Gait training;Stair training;Functional mobility training;Therapeutic activities;Therapeutic exercise;Patient/family education PT Recommendation Follow Up Recommendations: No PT follow up Equipment Recommended: None recommended by PT;None recommended by OT PT Goals  Acute Rehab PT Goals PT Goal Formulation: With patient Time For Goal Achievement: 7 days Pt will go Supine/Side to Sit: with modified independence PT Goal: Supine/Side to Sit  - Progress: Progressing toward goal Pt will go Sit to Stand: with modified independence PT Goal: Sit to Stand - Progress: Progressing toward goal Pt will go Stand to Sit: with modified independence PT Goal: Stand to Sit - Progress: Progressing toward goal Pt will Ambulate: >150 feet;with modified independence;with least restrictive assistive device PT Goal: Ambulate - Progress: Progressing toward goal Pt will Go Up / Down Stairs: 3-5 stairs;with rail(s);with supervision PT Goal: Up/Down Stairs - Progress: Not met Pt will Perform Home Exercise Program: Independently PT Goal: Perform Home Exercise Program - Progress: Not met  PT Evaluation Precautions/Restrictions  Precautions Precautions: Back Precaution Booklet Issued: No Required Braces or Orthoses: Yes Spinal Brace: Lumbar corset;Applied in sitting position Restrictions Weight Bearing Restrictions: No Prior Functioning  Home Living Lives With: Spouse Receives Help From: Family Type of Home: House Home Layout: One level Home Access: Stairs to enter Entrance Stairs-Rails: Lawyer of Steps: 4-5 Bathroom Shower/Tub: Forensic scientist: Handicapped height Bathroom Accessibility: Yes How Accessible: Accessible via walker Home Adaptive Equipment: Grab bars around toilet;Grab bars in shower;Walker - rolling;Straight cane Prior Function Level of Independence: Independent with basic ADLs;Independent with gait Able to Take Stairs?: Yes Driving: Yes Vocation: Full time employment Vocation Requirements: works at a [employer] - able to sit.  No lifting Comments: . Cognition Cognition Arousal/Alertness: Awake/alert Overall Cognitive Status: Appears within functional limits for tasks assessed Orientation Level: Oriented X4 Sensation/Coordination Sensation Light Touch: Appears Intact Extremity Assessment RLE Assessment RLE Assessment: Within Functional Limits LLE Assessment LLE  Assessment: Within Functional Limits Mobility (including Balance) Bed Mobility Bed Mobility: Yes Rolling Right:  5: Supervision Rolling Right Details (indicate cue type and reason): VC for proper sequencing to maintain back precautions throughout log rolling Right Sidelying to Sit: 5: Supervision Right Sidelying to Sit Details (indicate cue type and reason): VC for proper hand placement and sequencing Transfers Transfers: Yes Sit to Stand: 4: Min assist;With upper extremity assist;From bed Sit to Stand Details (indicate cue type and reason): Assist for forward translation secondary to pain. VC to maintain back precautions with standing and for hand palcement Stand to Sit: 5: Supervision;With armrests;To chair/3-in-1 Stand to Sit Details: VC for hand palcement Ambulation/Gait Ambulation/Gait: Yes Ambulation/Gait Assistance: 4: Min assist (Minguard assist) Ambulation/Gait Assistance Details (indicate cue type and reason): VC for proper gait sequencing. Pt relied on UE for support and stabilization to decreased pain Ambulation Distance (Feet): 20 Feet (two trials; to/from bathroom) Assistive device: Rolling walker Gait Pattern: Step-to pattern Gait velocity: Decreased gait speed Stairs: No (pt declined tunil next sesion)    Exercise    End of Session PT - End of Session Equipment Utilized During Treatment: Gait belt Activity Tolerance: Patient tolerated treatment well Patient left: in chair;with call bell in reach;with family/visitor present Nurse Communication: Mobility status for transfers;Mobility status for ambulation General Behavior During Session: [facility] for tasks performed Cognition: [facility] for tasks performed  Milana Kidney 12/12/2011, 5:39 PM  12/12/2011 Milana Kidney DPT PAGER: [phone] OFFICE: [id number]

## 2011-12-12 NOTE — Progress Notes (Signed)
Occupational Therapy Evaluation Patient Details Name: Scott Trevino MRN: [id number] DOB: July 20, 1951 Today's Date: 12/12/2011  Problem List: There is no problem list on file for this patient.   Past Medical History:  Past Medical History  Diagnosis Date  . Hypercholesteremia     takes Zocor daily  . Elevated PSA     takes Flomax daily  . Chronic back pain   . Lumbar stenosis   . Lumbar radiculopathy   . Lumbago   . Arthritis   . Fractured skull 1982  . Degenerative disc disease   . GERD (gastroesophageal reflux disease)     takes Omeprazole bid  . Urinary urgency   . Enlarged prostate   . Depression     takes Cymbalta daily  . Hypertension     takes Benazepril and Amlodipine daily   Past Surgical History:  Past Surgical History  Procedure Date  . Knee arthroscopy 10+yrs ago  . Back surgery 2010    x 2  . Cholecystectomy 2010  . Colonoscopy     OT Assessment/Plan/Recommendation OT Assessment Clinical Impression Statement: This 61 y.o. male presents to OT s/p Lumbar fusion.  Pt. demonstrates decreased independence with BADLs.  Pt. is doing well with initial mobility and BADLs, anticipate good progress.  Recommend OT to maximize safety and independence with BADLs to allow pt. to return home at modified independence to min A level from spouse OT Recommendation/Assessment: Patient will need skilled OT in the acute care venue OT Problem List: Decreased strength;Impaired balance (sitting and/or standing);Decreased knowledge of use of DME or AE;Decreased knowledge of precautions;Pain Barriers to Discharge: None OT Therapy Diagnosis : Generalized weakness;Acute pain OT Plan OT Frequency: Min 2X/week OT Treatment/Interventions: Self-care/ADL training;DME and/or AE instruction;Therapeutic activities;Patient/family education;Balance training OT Recommendation Recommendations for Other Services: PT consult Follow Up Recommendations: No OT follow up Equipment Recommended: None  recommended by OT Individuals Consulted Consulted and Agree with Results and Recommendations: Patient OT Goals Acute Rehab OT Goals OT Goal Formulation: With patient Time For Goal Achievement: 7 days ADL Goals Pt Will Perform Grooming: with modified independence;Standing at sink ADL Goal: Grooming - Progress: Not met Pt Will Perform Lower Body Bathing: with supervision;with adaptive equipment ADL Goal: Lower Body Bathing - Progress: Not met Pt Will Perform Lower Body Dressing: with supervision;Sit to stand from chair;with adaptive equipment ADL Goal: Lower Body Dressing - Progress: Not met Pt Will Transfer to Toilet: with modified independence;Ambulation;Comfort height toilet;Grab bars;Maintaining back safety precautions ADL Goal: Toilet Transfer - Progress: Not met Pt Will Perform Toileting - Clothing Manipulation: with modified independence;Standing ADL Goal: Toileting - Clothing Manipulation - Progress: Not met Pt Will Perform Toileting - Hygiene: with modified independence;with adaptive equipment;Sit to stand from 3-in-1/toilet ADL Goal: Toileting - Hygiene - Progress: Not met Pt Will Perform Tub/Shower Transfer: with min assist;Grab bars;Maintaining back safety precautions (min guard assist) ADL Goal: Tub/Shower Transfer - Progress: Not met  OT Evaluation Precautions/Restrictions  Precautions Precautions: Back Precaution Booklet Issued: No Required Braces or Orthoses: Yes Restrictions Weight Bearing Restrictions: No Prior Functioning Home Living Lives With: Spouse Type of Home: House Home Layout: One level Home Access: Stairs to enter Entrance Stairs-Rails: Lawyer of Steps: 4-5 Bathroom Shower/Tub: Forensic scientist: Handicapped height Bathroom Accessibility: Yes How Accessible: Accessible via walker Home Adaptive Equipment: Grab bars around toilet;Grab bars in shower;Walker - rolling;Straight cane Prior Function Level  of Independence: Independent with basic ADLs;Independent with gait (was working full time) Able to Take Stairs?: Yes Driving:  Yes Vocation: Full time employment Vocation Requirements: works at a [employer] - able to sit.  No lifting Comments: Wife available to assist him as necessary ADL ADL Eating/Feeding: Simulated;Independent Where Assessed - Eating/Feeding: Chair Grooming: Simulated;Wash/dry hands;Wash/dry face;Teeth care;Minimal assistance Where Assessed - Grooming: Standing at sink Upper Body Bathing: Simulated;Supervision/safety Where Assessed - Upper Body Bathing: Unsupported;Sitting, chair Lower Body Bathing: Simulated;Maximal assistance Where Assessed - Lower Body Bathing: Sit to stand from bed Upper Body Dressing: Simulated;Minimal assistance Where Assessed - Upper Body Dressing: Unsupported;Sitting, bed Lower Body Dressing: Simulated;Maximal assistance Where Assessed - Lower Body Dressing: Sit to stand from bed;Sit to stand from chair Toilet Transfer: Performed;Minimal assistance Toilet Transfer Method: Proofreader: Comfort height toilet;Grab bars Toileting - Clothing Manipulation: Simulated;Minimal assistance Where Assessed - Glass blower/designer Manipulation: Standing Toileting - Hygiene: Simulated;Minimal assistance Where Assessed - Toileting Hygiene: Sit on 3-in-1 or toilet Ambulation Related to ADLs: Pt. requires min A to ambulate in room.   ADL Comments: Pt. unable to cross ankles over knees for LB ADLs. Pt. reports he was able to do this PTA, but limited by pain today.  Pt. instructed in back precautions and safe techniques for LB ADLs Vision/Perception    Cognition Cognition Arousal/Alertness: Awake/alert Overall Cognitive Status: Appears within functional limits for tasks assessed Orientation Level: Oriented X4 Sensation/Coordination Coordination Gross Motor Movements are Fluid and Coordinated: Yes Fine Motor Movements are Fluid  and Coordinated: Yes Extremity Assessment RUE Assessment RUE Assessment: Within Functional Limits LUE Assessment LUE Assessment: Within Functional Limits Mobility  Bed Mobility Bed Mobility: Yes Rolling Right: 5: Supervision Right Sidelying to Sit: 5: Supervision Transfers Transfers: Yes Sit to Stand: 4: Min assist;With upper extremity assist Stand to Sit: 4: Min assist;With upper extremity assist Exercises   End of Session OT - End of Session Equipment Utilized During Treatment: Back brace Activity Tolerance: Patient tolerated treatment well Patient left: in chair;with call bell in reach (Pt. instructed to sit for one hour only, then change positio) Nurse Communication: Mobility status for transfers;Mobility status for ambulation General Behavior During Session: [facility] for tasks performed Cognition: [facility] for tasks performed   Tommye Lehenbauer, [name] 12/12/2011, 12:06 PM

## 2011-12-12 NOTE — Progress Notes (Signed)
Attempted to get pt up this morning to ambulate, however pt became nauseous with movement. Zofran administered. Pt prefers to keep foley in until able to tolerate movement. Will monitor.

## 2011-12-12 NOTE — Progress Notes (Signed)
Utilization review completed. Kwane Rohl, RN, BSN. 12/12/11  

## 2011-12-13 LAB — CBC
HCT: 34.5 % — ABNORMAL LOW (ref 39.0–52.0)
MCV: 92 fL (ref 78.0–100.0)
Platelets: 294 10*3/uL (ref 150–400)
RBC: 3.75 MIL/uL — ABNORMAL LOW (ref 4.22–5.81)
WBC: 15 10*3/uL — ABNORMAL HIGH (ref 4.0–10.5)

## 2011-12-13 MED ORDER — ADULT MULTIVITAMIN W/MINERALS CH
1.0000 | ORAL_TABLET | ORAL | Status: DC
  Administered 2011-12-14: 1 via ORAL
  Filled 2011-12-13: qty 1

## 2011-12-13 MED FILL — Heparin Sodium (Porcine) Inj 1000 Unit/ML: INTRAMUSCULAR | Qty: 30 | Status: AC

## 2011-12-13 MED FILL — Sodium Chloride Irrigation Soln 0.9%: Qty: 3000 | Status: AC

## 2011-12-13 MED FILL — Sodium Chloride IV Soln 0.9%: INTRAVENOUS | Qty: 1000 | Status: AC

## 2011-12-13 NOTE — Progress Notes (Signed)
Physical Therapy Treatment Patient Details Name: Scott Trevino MRN: [id number] DOB: 07/10/1951 Today's Date: 12/13/2011  PT Assessment/Plan  PT - Assessment/Plan Comments on Treatment Session: Pt making progress with gait and stairs, Mod I/S with all mobility. Pt has no further questions or concerns for PT and plans to DC tomorrow. Pt is eager to begin oupt PT once able to progress strengthening.  PT Plan: Discharge plan remains appropriate Follow Up Recommendations: No PT follow up Equipment Recommended: None recommended by PT PT Goals  Acute Rehab PT Goals PT Goal: Supine/Side to Sit - Progress: Progressing toward goal PT Goal: Sit to Stand - Progress: Met PT Goal: Stand to Sit - Progress: Met PT Goal: Ambulate - Progress: Progressing toward goal PT Goal: Up/Down Stairs - Progress: Met PT Goal: Perform Home Exercise Program - Progress:  (Not addressed)  PT Treatment Precautions/Restrictions  Precautions Precautions: Back Precaution Booklet Issued: No Precaution Comments: Pt able to recall 3/3 precautions with handout in room Required Braces or Orthoses: Yes Spinal Brace: Lumbar corset;Applied in sitting position Restrictions Weight Bearing Restrictions: No Mobility (including Balance) Bed Mobility Bed Mobility: Yes Rolling Right: 5: Supervision Rolling Right Details (indicate cue type and reason): Cues for logrolling as pt attempted to sit up straight Right Sidelying to Sit: 5: Supervision Right Sidelying to Sit Details (indicate cue type and reason): Cues for timing and sequence Transfers Transfers: Yes Sit to Stand: 6: Modified independent (Device/Increase time) Sit to Stand Details (indicate cue type and reason): Safe technique noted Stand to Sit: 6: Modified independent (Device/Increase time) Stand to Sit Details: Definite use of UE to control descent Ambulation/Gait Ambulation/Gait: Yes Ambulation/Gait Assistance: 5: Supervision Ambulation/Gait Assistance Details  (indicate cue type and reason): Cues for heel strike and posture. Pt cued to keep all RW legs on ground at all times.  Ambulation Distance (Feet): 75 Feet (x2) Assistive device: Rolling walker Gait Pattern: Step-through pattern;Decreased stride length Gait velocity: Decreased gait speed Stairs: Yes Stairs Assistance: 5: Supervision Stairs Assistance Details (indicate cue type and reason): Cues for step-to pattern for safety Stair Management Technique: Two rails;Step to pattern Number of Stairs: 5  Height of Stairs: 6     Exercise    End of Session PT - End of Session Equipment Utilized During Treatment: Gait belt Activity Tolerance: Patient limited by fatigue (Pt not wanting to walk anymore due to fatigue) Patient left: in chair;with call bell in reach Nurse Communication: Mobility status for ambulation;Mobility status for transfers General Behavior During Session: [facility] for tasks performed Cognition: [facility] for tasks performed  [facility] [place], [place] [phone]  12/13/2011, 2:37 PM

## 2011-12-13 NOTE — Plan of Care (Signed)
Problem: Phase II Progression Outcomes Goal: Progress activity as tolerated unless otherwise ordered Outcome: Progressing Pt able to tolerate gait x75' with RW and up/down 5 stairs with supervision.  Clydene Laming, [place] [phone]  12/13/2011 2:39 PM

## 2011-12-13 NOTE — Progress Notes (Signed)
Patient ID: Scott Trevino, male   DOB: Jun 12, 1951, 61 y.o.   MRN: [id number] Subjective:  The patient is alert and pleasant. He looks well. He has no complaints. He had some dysuria yesterday but this has resolved.  Objective: Vital signs in last 24 hours: Temp:  [98.7 F (37.1 C)-100.5 F (38.1 C)] 100.4 F (38 C) (01/11 0600) Pulse Rate:  [100-117] 117  (01/11 0600) Resp:  [16-20] 20  (01/11 0600) BP: (98-141)/(57-80) 141/74 mmHg (01/11 0600) SpO2:  [90 %-98 %] 90 % (01/11 0600)  Intake/Output from previous day: 01/10 0701 - 01/11 0700 In: 120 [P.O.:120] Out: -  Intake/Output this shift:    Physical exam the patient is alert and oriented. His dressing is clean and dry. His strength is grossly normal.  Lab Results:  Basename 12/13/11 0540 12/12/11 0640  WBC 15.0* 16.6*  HGB 11.3* 12.3*  HCT 34.5* 38.1*  PLT 294 290   BMET  Basename 12/12/11 0640  NA 137  K 5.0  CL 98  CO2 31  GLUCOSE 147*  BUN 13  CREATININE 0.82  CALCIUM 8.8    Studies/Results: Dg Chest 2 View  12/12/2011  *RADIOLOGY REPORT*  Clinical Data:  Recent surgery now with fever.  CHEST - 2 VIEW  Comparison: 08/24/2009  Findings: Poor inspiratory effort similar to priors.  Plate-like atelectasis left mid lung zone more prominent.  Right base scarring or atelectasis redemonstrated and fairly stable. Cardiomegaly.  IMPRESSION: Right base and left midlung zone atelectatic changes are relatively mild and similar to priors in 2010.  No acute infiltrates, pneumothorax, or significant effusion.  Original Report Authenticated By: Elsie Stain, M.D.   Dg Lumbar Spine 2-3 Views  12/11/2011  *RADIOLOGY REPORT*  Clinical Data: L3-4 posterior fusion.  Removal of old hardware and placement of new hardware at L4-5.  LUMBAR SPINE - 2-3 VIEW  Comparison: 10/04/2011  Findings: Two intraoperative spot images demonstrate posterior pedicle screws at L3, L4 and L5.  Removal of prior hardware at L4- 5.  No complicating feature.   IMPRESSION: Posterior pedicle screws at L3, L4 and L5.  Original Report Authenticated By: Cyndie Chime, M.D.   Dg Lumbar Spine 1 View  12/11/2011  *RADIOLOGY REPORT*  Clinical Data: Lumbar stenosis and radiculopathy.  LUMBAR SPINE - 1 VIEW  Comparison: None.  Findings: Intraoperative cross-table lateral view shows pedicle screws and posterior fixation rods in place at L4-5. A probe is seen just superior to this hardware, overlying the interspinous space at the level of L3-4.  IMPRESSION: Intraoperative localization of posterior zL3-4 interspinous space.  Original Report Authenticated By: Danae Orleans, M.D.    Assessment/Plan: Postop day #2: The patient is doing well. We will continue to mobilize him with PT and OT. The patient will likely go home on Saturday. I have given him his instructions and answered all his questions.  Low-grade fever: The patient's incision looks fine. His urinalysis looks good. His white count is down.  LOS: 2 days     [name] D 12/13/2011, 7:56 AM

## 2011-12-13 NOTE — Progress Notes (Signed)
Occupational Therapy Treatment Patient Details Name: Scott Trevino MRN: [id number] DOB: 08-29-1951 Today's Date: 12/13/2011  OT Assessment/Plan OT Assessment/Plan Comments on Treatment Session: Pt. moving well.  Pt has achieved all goals and all education completed.  No futher OT needs identified.  Will sign off. OT Plan: Discharge plan remains appropriate Follow Up Recommendations: No OT follow up Equipment Recommended: None recommended by OT OT Goals ADL Goals ADL Goal: Grooming - Progress: Met ADL Goal: Lower Body Bathing - Progress: Met ADL Goal: Lower Body Dressing - Progress: Met ADL Goal: Toilet Transfer - Progress: Met ADL Goal: Toileting - Clothing Manipulation - Progress: Met ADL Goal: Toileting - Hygiene - Progress: Met ADL Goal: Tub/Shower Transfer - Progress: Met  OT Treatment Precautions/Restrictions  Precautions Precautions: Back Required Braces or Orthoses: Yes Spinal Brace: Lumbar corset;Applied in sitting position Restrictions Weight Bearing Restrictions: No   ADL ADL Grooming: Simulated;Modified independent Where Assessed - Grooming: Standing at sink Lower Body Bathing: Simulated;Supervision/safety Where Assessed - Lower Body Bathing: Sit to stand from chair;Sit to stand from bed;Standing in shower Lower Body Dressing: Simulated;Supervision/safety Where Assessed - Lower Body Dressing: Sit to stand from chair;Sit to stand from bed Toilet Transfer: Simulated;Modified independent Toilet Transfer Method: Proofreader: Comfort height toilet;Grab bars Toileting - Clothing Manipulation: Simulated;Modified independent Where Assessed - Toileting Clothing Manipulation: Standing Toileting - Hygiene: Simulated;Independent Where Assessed - Toileting Hygiene: Sit on 3-in-1 or toilet Tub/Shower Transfer: Performed;Supervision/safety Tub/Shower Transfer Method: Ambulating Tub/Shower Transfer Equipment: Grab bars Ambulation Related to ADLs: Pt.  ambulating modified independently in room ADL Comments: Pt now able to cross ankles over knees to access feet safely for LB ADLs.  Pt. verbalizes 3/3/ precautions independently and demonstrates good understanding of back precautions.   Mobility  Bed Mobility Bed Mobility: No Transfers Transfers: Yes Sit to Stand: 6: Modified independent (Device/Increase time) Stand to Sit: 6: Modified independent (Device/Increase time) Exercises    End of Session OT - End of Session Equipment Utilized During Treatment: Back brace Activity Tolerance: Patient tolerated treatment well Patient left: in chair;with call bell in reach Nurse Communication: Mobility status for transfers;Mobility status for ambulation General Behavior During Session: [name] for tasks performed Cognition: [name] for tasks performed  Scott Trevino, Scott Trevino  12/13/2011, 12:40 PM

## 2011-12-14 MED ORDER — DIAZEPAM 5 MG PO TABS: 5.0000 mg | ORAL_TABLET | Freq: Four times a day (QID) | ORAL | Status: AC | PRN

## 2011-12-14 MED ORDER — OXYCODONE-ACETAMINOPHEN 5-325 MG PO TABS: 1.0000 | ORAL_TABLET | ORAL | Status: AC | PRN

## 2011-12-14 NOTE — Progress Notes (Signed)
CSW received referral for SNF. No f/u recommendations by PT/OT and MD's d/c summary for today noted. CSW signing off. Dellie Burns, MSW, [place] [phone]

## 2011-12-14 NOTE — Progress Notes (Signed)
Physical Therapy Treatment and Discharge Note Patient Details Name: Scott Trevino MRN: [id number] DOB: 1951/06/06 Today's Date: 12/14/2011  PT Assessment/Plan  PT - Assessment/Plan Comments on Treatment Session: Patient is modified independent with all mobilty and will be discahrged from therapy services PT Plan: All goals met and education completed, patient dischaged from PT services Follow Up Recommendations: No PT follow up Equipment Recommended: None recommended by PT PT Goals  Acute Rehab PT Goals PT Goal: Supine/Side to Sit - Progress: Met PT Goal: Sit to Stand - Progress: Met PT Goal: Stand to Sit - Progress: Met PT Goal: Ambulate - Progress: Met PT Goal: Perform Home Exercise Program - Progress: Discontinued (comment) (Patient recalls all back precautions) No requirement for exercise at this time  PT Treatment Precautions/Restrictions  Precautions Precautions: Back Precaution Booklet Issued: Yes (comment) Precaution Comments: Educated in back precautions, posture, and body mechanics. Patietn able to recall all from prior education Required Braces or Orthoses: Yes Spinal Brace: Applied in sitting position;Lumbar corset Restrictions Weight Bearing Restrictions: No Mobility (including Balance) Bed Mobility - practiced with bed at height at home and in/out on side at home Rolling Left: 6: Modified independent (Device/Increase time) (HOB flat and without rail ) Right Sidelying to Sit: 6: Modified independent (Device/Increase time) Left Sidelying to Sit: 6: Modified independent (Device/Increase time) (Simulated night stand use for support) Transfers Sit to Stand: 6: Modified independent (Device/Increase time);From chair/3-in-1 Sit to Stand Details (indicate cue type and reason): Patient did require initial instruction for correct hand placement to and from device but performed safely on subsequent attempts Stand to Sit: 6: Modified independent (Device/Increase time);To  chair/3-in-1 Ambulation/Gait Ambulation/Gait Assistance: 6: Modified independent (Device/Increase time) Ambulation/Gait Assistance Details (indicate cue type and reason): Decreased gait velcoity secondary to pain Ambulation Distance (Feet): 170 Feet Assistive device: Rolling walker Gait Pattern: Step-through pattern;Decreased stride length    End of Session PT - End of Session Equipment Utilized During Treatment: Back brace Activity Tolerance: Patient tolerated treatment well Patient left: in chair;with call bell in reach General Behavior During Session: [facility] for tasks performed Cognition: [facility] for tasks performed  Edwyna Perfect, PT  Pager [phone]  12/14/2011, 8:57 AM

## 2011-12-14 NOTE — Discharge Summary (Signed)
Physician Discharge Summary  Patient ID: Scott Trevino MRN: [id number] DOB/AGE: Jun 11, 1951 61 y.o.  Admit date: 12/11/2011 Discharge date: 12/14/2011  Admission Diagnoses: Degenerative disease L3-4  Discharge Diagnoses: Same Active Problems:  * No active hospital problems. *    Discharged Condition: good  Hospital Course: Patient was admitted on January 9 underwent a L3 for fusion postoperative the patient did very well went to recovery room and the floor on the floor he is convalesced well progressively improved and his pain ambulating and voiding spontaneously and tolerating a regular diet. Stable to be discharged home scheduled followup approximately one week with Dr. Lovell Sheehan on oral analgesics.  Consults: none  Significant Diagnostic Studies:   Treatments: surgery: L3 for fusion  Discharge Exam: Blood pressure 135/80, pulse 96, temperature 99.2 F (37.3 C), temperature source Oral, resp. rate 18, height 6\' 3"  (1.905 m), weight 114.306 kg (252 lb), SpO2 97.00%. Patient is awake alert oriented strength is 5 out of 5 in his lower extremities and his wound is clean and dry.  Disposition:    Medication List  As of 12/14/2011  8:12 AM   TAKE these medications         amLODipine 5 MG tablet   Commonly known as: NORVASC   Take 5 mg by mouth every morning.      aspirin 81 MG tablet   Take 81 mg by mouth every morning. Hold for procedure      benazepril 40 MG tablet   Commonly known as: LOTENSIN   Take 40 mg by mouth every morning.      cyclobenzaprine 10 MG tablet   Commonly known as: FLEXERIL   Take 10 mg by mouth at bedtime.      diazepam 5 MG tablet   Commonly known as: VALIUM   Take 1 tablet (5 mg total) by mouth every 6 (six) hours as needed.      diclofenac 75 MG EC tablet   Commonly known as: VOLTAREN   Take 75 mg by mouth 2 (two) times daily.      DULoxetine 60 MG capsule   Commonly known as: CYMBALTA   Take 60 mg by mouth every morning.     HYDROcodone-acetaminophen 5-500 MG per tablet   Commonly known as: VICODIN   Take 1-2 tablets by mouth every 6 (six) hours as needed. For pain      multivitamins ther. w/minerals Tabs   Take 1 tablet by mouth every morning.      omeprazole 20 MG capsule   Commonly known as: PRILOSEC   Take 20 mg by mouth 2 (two) times daily.      oxyCODONE-acetaminophen 5-325 MG per tablet   Commonly known as: PERCOCET   Take 1-2 tablets by mouth every 4 (four) hours as needed.      simvastatin 80 MG tablet   Commonly known as: ZOCOR   Take 80 mg by mouth at bedtime.      Tamsulosin HCl 0.4 MG Caps   Commonly known as: FLOMAX   Take 0.4 mg by mouth daily after breakfast.           Follow-up Information    Follow up in 1 week.         Signed: Joakim Huesman P 12/14/2011, 8:12 AM

## 2011-12-14 NOTE — Progress Notes (Signed)
Subjective: Patient reports That he is doing well he sitting up in a chair he is having no leg pain very minimal back pain well controlled on pills.  Objective: Vital signs in last 24 hours: Temp:  [98.1 F (36.7 C)-99.2 F (37.3 C)] 99.2 F (37.3 C) (01/12 0420) Pulse Rate:  [96-106] 96  (01/12 0420) Resp:  [18-20] 18  (01/12 0420) BP: (126-141)/(72-85) 135/80 mmHg (01/12 0420) SpO2:  [83 %-97 %] 97 % (01/12 0420)  Intake/Output from previous day: 01/11 0701 - 01/12 0700 In: 260 [P.O.:260] Out: -  Intake/Output this shift:    Strength is 5 out of 5 wound is clean and dry.  Lab Results:  Basename 12/13/11 0540 12/12/11 0640  WBC 15.0* 16.6*  HGB 11.3* 12.3*  HCT 34.5* 38.1*  PLT 294 290   BMET  Basename 12/12/11 0640  NA 137  K 5.0  CL 98  CO2 31  GLUCOSE 147*  BUN 13  CREATININE 0.82  CALCIUM 8.8    Studies/Results: Dg Chest 2 View  12/12/2011  *RADIOLOGY REPORT*  Clinical Data:  Recent surgery now with fever.  CHEST - 2 VIEW  Comparison: 08/24/2009  Findings: Poor inspiratory effort similar to priors.  Plate-like atelectasis left mid lung zone more prominent.  Right base scarring or atelectasis redemonstrated and fairly stable. Cardiomegaly.  IMPRESSION: Right base and left midlung zone atelectatic changes are relatively mild and similar to priors in 2010.  No acute infiltrates, pneumothorax, or significant effusion.  Original Report Authenticated By: Elsie Stain, M.D.    Assessment/Plan: 61 year old gentleman postop day 3 from fusion doing very well we'll plan on discharge today  LOS: 3 days     Scott Trevino P 12/14/2011, 8:08 AM

## 2013-05-31 ENCOUNTER — Inpatient Hospital Stay: Payer: Self-pay | Admitting: Internal Medicine

## 2013-05-31 LAB — COMPREHENSIVE METABOLIC PANEL
Albumin: 3.4 g/dL (ref 3.4–5.0)
Alkaline Phosphatase: 91 U/L (ref 50–136)
Anion Gap: 7 (ref 7–16)
BUN: 40 mg/dL — ABNORMAL HIGH (ref 7–18)
Bilirubin,Total: 0.4 mg/dL (ref 0.2–1.0)
Co2: 27 mmol/L (ref 21–32)
Creatinine: 2.33 mg/dL — ABNORMAL HIGH (ref 0.60–1.30)
Potassium: 3.4 mmol/L — ABNORMAL LOW (ref 3.5–5.1)
Sodium: 137 mmol/L (ref 136–145)

## 2013-05-31 LAB — CBC WITH DIFFERENTIAL/PLATELET
Basophil %: 0.4 %
Eosinophil %: 0.8 %
Lymphocyte #: 3.9 10*3/uL — ABNORMAL HIGH (ref 1.0–3.6)
MCH: 29.9 pg (ref 26.0–34.0)
MCV: 90 fL (ref 80–100)
Platelet: 337 10*3/uL (ref 150–440)
WBC: 16.7 10*3/uL — ABNORMAL HIGH (ref 3.8–10.6)

## 2013-06-01 LAB — URINALYSIS, COMPLETE
Bacteria: NONE SEEN
Bilirubin,UR: NEGATIVE
Blood: NEGATIVE
Ketone: NEGATIVE
Squamous Epithelial: NONE SEEN

## 2013-06-01 LAB — CBC WITH DIFFERENTIAL/PLATELET
Lymphocyte %: 24.6 %
MCHC: 34.5 g/dL (ref 32.0–36.0)
Monocyte #: 0.9 x10 3/mm (ref 0.2–1.0)
Neutrophil #: 9.6 10*3/uL — ABNORMAL HIGH (ref 1.4–6.5)
Neutrophil %: 67.3 %
Platelet: 323 10*3/uL (ref 150–440)
RBC: 3.79 10*6/uL — ABNORMAL LOW (ref 4.40–5.90)

## 2013-06-01 LAB — BASIC METABOLIC PANEL
Anion Gap: 5 — ABNORMAL LOW (ref 7–16)
BUN: 29 mg/dL — ABNORMAL HIGH (ref 7–18)
Chloride: 110 mmol/L — ABNORMAL HIGH (ref 98–107)
EGFR (Non-African Amer.): 51 — ABNORMAL LOW
Potassium: 4 mmol/L (ref 3.5–5.1)
Sodium: 143 mmol/L (ref 136–145)

## 2013-06-01 LAB — CK TOTAL AND CKMB (NOT AT ARMC)
CK, Total: 88 U/L (ref 35–232)
CK-MB: 2.8 ng/mL (ref 0.5–3.6)

## 2013-06-01 LAB — TROPONIN I: Troponin-I: <0.02 ng/mL

## 2013-06-02 LAB — COMPREHENSIVE METABOLIC PANEL
BUN: 18 mg/dL (ref 7–18)
EGFR (African American): >60
Osmolality: 284 (ref 275–301)
SGOT(AST): 16 U/L (ref 15–37)
Sodium: 141 mmol/L (ref 136–145)

## 2013-06-02 LAB — CBC WITH DIFFERENTIAL/PLATELET
Basophil %: 0.2 %
Eosinophil #: 0.2 10*3/uL (ref 0.0–0.7)
Eosinophil %: 1.7 %
HCT: 33.9 % — ABNORMAL LOW (ref 40.0–52.0)
Lymphocyte #: 3.3 10*3/uL (ref 1.0–3.6)
MCHC: 33.9 g/dL (ref 32.0–36.0)
Monocyte #: 0.8 x10 3/mm (ref 0.2–1.0)
Neutrophil %: 62.3 %
Platelet: 319 10*3/uL (ref 150–440)
WBC: 11.6 10*3/uL — ABNORMAL HIGH (ref 3.8–10.6)

## 2013-06-05 LAB — CULTURE, BLOOD (SINGLE)

## 2013-08-02 ENCOUNTER — Emergency Department: Payer: Self-pay | Admitting: Emergency Medicine

## 2013-08-02 LAB — COMPREHENSIVE METABOLIC PANEL
Alkaline Phosphatase: 96 U/L (ref 50–136)
Calcium, Total: 9.2 mg/dL (ref 8.5–10.1)
Chloride: 101 mmol/L (ref 98–107)
Creatinine: 2.48 mg/dL — ABNORMAL HIGH (ref 0.60–1.30)
Glucose: 116 mg/dL — ABNORMAL HIGH (ref 65–99)
Potassium: 4.1 mmol/L (ref 3.5–5.1)

## 2013-08-02 LAB — CBC
HGB: 14.4 g/dL (ref 13.0–18.0)
MCH: 30.1 pg (ref 26.0–34.0)
MCV: 90 fL (ref 80–100)
Platelet: 363 10*3/uL (ref 150–440)
RDW: 14.1 % (ref 11.5–14.5)
WBC: 15.7 10*3/uL — ABNORMAL HIGH (ref 3.8–10.6)

## 2013-08-02 LAB — CK TOTAL AND CKMB (NOT AT ARMC): CK-MB: 2.5 ng/mL (ref 0.5–3.6)

## 2013-08-02 LAB — TROPONIN I: Troponin-I: <0.02 ng/mL

## 2015-03-24 NOTE — H&P (Signed)
PATIENT NAME:  Scott Trevino, Scott Trevino MR#:  [id number] DATE OF BIRTH:  11-07-1951  DATE OF ADMISSION:  05/31/2013  CHIEF COMPLAINT: Diarrhea followed by dizziness, blurring of vision, lightheadedness and sweating.   HISTORY OF PRESENT ILLNESS: The patient is a 64 year old male with a history of hypertension, hyperlipidemia, chronic kidney disease who presented to the ER complaining of diarrhea that started around lunchtime. The patient states that he went 3 or 4 times over 15 minute span and subsequently became dizzy with blurred vision associated with sweating, abdominal cramps. The patient also complained of some pain in the right posterior aspect of his neck, which has since resolved. He reports feeling weak and was noted to be hypotensive in the ED with a blood pressure of 78/51 and elevated serum creatinine of 2.33 the patient's baseline creatinine being about 1.4. He was also noted to have an elevated white cell count of 16.7. The patient had recently completed a course of prednisone taper for pain in his back and neck.   PAST MEDICAL HISTORY: Significant for: 1. Hypertension. 2. Hyperlipidemia.  3. History of previous GI bleed from hemorrhoids.  4. Chronic headaches.  5. History of skull fracture in the 1980s.  6. History of environmental allergies.  7. Palpitations.  8. Low back pain with sciatica.  9. Benign prostatic hypertrophy and elevated PSA.  10. Degenerative disk disease with previous back surgery and lumbar laminectomy.  11. Previous cholecystectomy.   CURRENT MEDICATIONS:  1. Aspirin 81 mg a day.  2. Multivitamin 1 tablet a day.  3. Simvastatin 80 mg at bedtime.  4. Avodart 0.5 mg every other day.  5. Celebrex 200 mg once a day. 6. Cyclobenzaprine 10 mg every 8 hours p.r.n.  7. Benicar hydrochlorothiazide 40/12.5 one tab once a day.  8. Norco 1 tablet every 6 hours p.r.n.  9. Cymbalta 60 mg once a day.  10. Omeprazole extended release 20 mg once a day.   ALLERGIES:  PENICILLIN.   FAMILY HISTORY: Positive for hypertension, congestive heart failure.   SOCIAL HISTORY: The patient quit smoking several years back. Denies any consumption of alcohol. He works at [employer]. He is married.   REVIEW OF SYSTEMS: The patient denies any chest pain. No shortness of breath. No headaches. No fevers, been having some chills and also had some nausea.   PHYSICAL EXAMINATION: VITAL SIGNS: Temperature 98, pulse 74, respirations 20, blood pressure 78/51, oxygen saturation 97% on room air.   HEENT: Normocephalic, atraumatic. No JVD.   HEART: S1, S2.   LUNGS: Clear to auscultation.   ABDOMEN: Soft, nontender.   EXTREMITIES: No edema.   NEUROLOGIC: Alert and oriented x 3. No obvious focal weakness   LABS/STUDIES: WBC count 16.7, hemoglobin 11.8, hematocrit 35.4, platelets 337. Glucose 117, BUN 40 creatinine 2.33, sodium 137, potassium 3.4, chloride 103, CO2 27, EGFR 29, calcium 8.3, CK was 120, MB was 2.5. Troponin less than 0.02. Lactic acid 0.9. CT scan of the abdomen and pelvis showed stones in the left kidney but no evidence of obstruction. There was moderate enlargement of the prostate gland which leaves a prominent impression of the urinary bladder base. There was incomplete distention sigmoid colon with scattered diverticula. There is no objective evidence of acute diverticulitis with low grade diverticulitis cannot be excluded. There was no evidence of acute appendicitis or small or large amount of obstruction.  The gallbladder is surgically absent. There was no evidence of acute hepatobiliary abnormality.   IMPRESSION:  1. Acute renal  failure secondary to diarrhea and dehydration and volume loss, possible acute diverticulitis.  2. History of hypertension.  3. History of benign prostatic hypertrophy.   PLAN: Admit patient to Critical Care Unit. We will hold off on antihypertensive medications.  Will treat aggressively with intravenous  fluids. Cycle cardiac enzymes and follow renal function closely. Continue IV antibiotics including Cipro and Flagyl for possible diverticulitis, although suspicion is low.  Recent cessation of steroids, there is a possibility of adrenal insufficiency. Will check a random cortisol level. I discussed his overall condition and plan of action with the patient and family.    ____________________________ Tracie Harrier, MD vh:rw D: 05/31/2013 18:08:58 ET T: 05/31/2013 18:54:04 ET JOB#: [id number]  cc: Tracie Harrier, MD, <Dictator> Tracie Harrier MD ELECTRONICALLY SIGNED 06/24/2013 19:01

## 2020-02-02 DIAGNOSIS — E782 Mixed hyperlipidemia: Secondary | ICD-10-CM | POA: Diagnosis not present

## 2020-02-02 DIAGNOSIS — M109 Gout, unspecified: Secondary | ICD-10-CM | POA: Diagnosis not present

## 2020-03-20 DIAGNOSIS — M25561 Pain in right knee: Secondary | ICD-10-CM | POA: Diagnosis not present

## 2020-03-31 DIAGNOSIS — M25561 Pain in right knee: Secondary | ICD-10-CM | POA: Diagnosis not present

## 2020-07-12 DIAGNOSIS — Z6832 Body mass index (BMI) 32.0-32.9, adult: Secondary | ICD-10-CM | POA: Diagnosis not present

## 2020-07-12 DIAGNOSIS — M545 Low back pain: Secondary | ICD-10-CM | POA: Diagnosis not present

## 2020-07-12 DIAGNOSIS — M5136 Other intervertebral disc degeneration, lumbar region: Secondary | ICD-10-CM | POA: Diagnosis not present

## 2020-08-17 DIAGNOSIS — M48062 Spinal stenosis, lumbar region with neurogenic claudication: Secondary | ICD-10-CM | POA: Diagnosis not present

## 2020-08-17 DIAGNOSIS — M5136 Other intervertebral disc degeneration, lumbar region: Secondary | ICD-10-CM | POA: Diagnosis not present

## 2020-09-05 DIAGNOSIS — M48062 Spinal stenosis, lumbar region with neurogenic claudication: Secondary | ICD-10-CM | POA: Diagnosis not present

## 2020-09-06 DIAGNOSIS — Z1331 Encounter for screening for depression: Secondary | ICD-10-CM | POA: Diagnosis not present

## 2020-09-06 DIAGNOSIS — E782 Mixed hyperlipidemia: Secondary | ICD-10-CM | POA: Diagnosis not present

## 2020-09-06 DIAGNOSIS — Z6832 Body mass index (BMI) 32.0-32.9, adult: Secondary | ICD-10-CM | POA: Diagnosis not present

## 2020-09-06 DIAGNOSIS — R7303 Prediabetes: Secondary | ICD-10-CM | POA: Diagnosis not present

## 2020-09-06 DIAGNOSIS — R7309 Other abnormal glucose: Secondary | ICD-10-CM | POA: Diagnosis not present

## 2020-09-06 DIAGNOSIS — M545 Low back pain, unspecified: Secondary | ICD-10-CM | POA: Diagnosis not present

## 2020-09-06 DIAGNOSIS — Z0001 Encounter for general adult medical examination with abnormal findings: Secondary | ICD-10-CM | POA: Diagnosis not present

## 2020-09-06 DIAGNOSIS — Z23 Encounter for immunization: Secondary | ICD-10-CM | POA: Diagnosis not present

## 2020-09-06 DIAGNOSIS — Z1389 Encounter for screening for other disorder: Secondary | ICD-10-CM | POA: Diagnosis not present

## 2020-09-06 DIAGNOSIS — I10 Essential (primary) hypertension: Secondary | ICD-10-CM | POA: Diagnosis not present

## 2020-09-06 DIAGNOSIS — F17211 Nicotine dependence, cigarettes, in remission: Secondary | ICD-10-CM | POA: Diagnosis not present

## 2020-09-11 DIAGNOSIS — Z981 Arthrodesis status: Secondary | ICD-10-CM | POA: Diagnosis not present

## 2020-09-11 DIAGNOSIS — M48062 Spinal stenosis, lumbar region with neurogenic claudication: Secondary | ICD-10-CM | POA: Diagnosis not present

## 2020-09-11 DIAGNOSIS — M5136 Other intervertebral disc degeneration, lumbar region: Secondary | ICD-10-CM | POA: Diagnosis not present

## 2020-10-10 DIAGNOSIS — M5416 Radiculopathy, lumbar region: Secondary | ICD-10-CM | POA: Diagnosis not present

## 2020-11-13 DIAGNOSIS — M5416 Radiculopathy, lumbar region: Secondary | ICD-10-CM | POA: Diagnosis not present

## 2021-01-02 DIAGNOSIS — R319 Hematuria, unspecified: Secondary | ICD-10-CM | POA: Diagnosis not present

## 2021-01-02 DIAGNOSIS — Z6833 Body mass index (BMI) 33.0-33.9, adult: Secondary | ICD-10-CM | POA: Diagnosis not present

## 2021-01-29 DIAGNOSIS — R8290 Unspecified abnormal findings in urine: Secondary | ICD-10-CM | POA: Diagnosis not present

## 2021-04-05 DIAGNOSIS — H2513 Age-related nuclear cataract, bilateral: Secondary | ICD-10-CM | POA: Diagnosis not present

## 2021-09-17 DIAGNOSIS — Z23 Encounter for immunization: Secondary | ICD-10-CM | POA: Diagnosis not present

## 2021-09-28 DIAGNOSIS — M5416 Radiculopathy, lumbar region: Secondary | ICD-10-CM | POA: Diagnosis not present

## 2021-10-15 DIAGNOSIS — Z1389 Encounter for screening for other disorder: Secondary | ICD-10-CM | POA: Diagnosis not present

## 2021-10-15 DIAGNOSIS — I10 Essential (primary) hypertension: Secondary | ICD-10-CM | POA: Diagnosis not present

## 2021-10-15 DIAGNOSIS — K219 Gastro-esophageal reflux disease without esophagitis: Secondary | ICD-10-CM | POA: Diagnosis not present

## 2021-10-15 DIAGNOSIS — E782 Mixed hyperlipidemia: Secondary | ICD-10-CM | POA: Diagnosis not present

## 2021-10-15 DIAGNOSIS — Z7982 Long term (current) use of aspirin: Secondary | ICD-10-CM | POA: Diagnosis not present

## 2021-10-15 DIAGNOSIS — Z79899 Other long term (current) drug therapy: Secondary | ICD-10-CM | POA: Diagnosis not present

## 2021-10-15 DIAGNOSIS — N400 Enlarged prostate without lower urinary tract symptoms: Secondary | ICD-10-CM | POA: Diagnosis not present

## 2021-10-15 DIAGNOSIS — M109 Gout, unspecified: Secondary | ICD-10-CM | POA: Diagnosis not present

## 2021-10-15 DIAGNOSIS — Z0001 Encounter for general adult medical examination with abnormal findings: Secondary | ICD-10-CM | POA: Diagnosis not present

## 2021-10-15 DIAGNOSIS — M5136 Other intervertebral disc degeneration, lumbar region: Secondary | ICD-10-CM | POA: Diagnosis not present

## 2021-10-15 DIAGNOSIS — R7309 Other abnormal glucose: Secondary | ICD-10-CM | POA: Diagnosis not present

## 2021-10-15 DIAGNOSIS — Z6833 Body mass index (BMI) 33.0-33.9, adult: Secondary | ICD-10-CM | POA: Diagnosis not present

## 2021-10-15 DIAGNOSIS — Z1331 Encounter for screening for depression: Secondary | ICD-10-CM | POA: Diagnosis not present

## 2021-10-15 DIAGNOSIS — Z23 Encounter for immunization: Secondary | ICD-10-CM | POA: Diagnosis not present

## 2021-10-15 DIAGNOSIS — F17211 Nicotine dependence, cigarettes, in remission: Secondary | ICD-10-CM | POA: Diagnosis not present

## 2021-10-17 DIAGNOSIS — M25552 Pain in left hip: Secondary | ICD-10-CM | POA: Diagnosis not present

## 2021-10-23 ENCOUNTER — Other Ambulatory Visit: Payer: Self-pay

## 2021-10-23 ENCOUNTER — Encounter: Payer: Self-pay | Admitting: Urology

## 2021-10-23 ENCOUNTER — Ambulatory Visit: Payer: PPO | Admitting: Urology

## 2021-10-23 VITALS — BP 144/73 | HR 87 | Ht 72.0 in | Wt 248.0 lb

## 2021-10-23 DIAGNOSIS — R972 Elevated prostate specific antigen [PSA]: Secondary | ICD-10-CM | POA: Diagnosis not present

## 2021-10-23 DIAGNOSIS — Z87898 Personal history of other specified conditions: Secondary | ICD-10-CM | POA: Diagnosis not present

## 2021-10-23 NOTE — Progress Notes (Signed)
10/23/21 9:41 AM   Scott Trevino Scott Trevino 1950/12/17 [id number]  CC: Elevated PSA  HPI: I saw Scott Trevino today for evaluation of an elevated PSA of 12.4.  This was increased significantly from his last value of 3.9 in 2019.  He reportedly had a prostate biopsy at [facility] around 15 years ago that was negative, but he is unsure what his PSA was at that time.  He denies any family history of prostate or bladder cancer.  Denies any gross hematuria.   PMH: Past Medical History:  Diagnosis Date   Arthritis    Chronic back pain    Degenerative disc disease    Depression    takes Cymbalta daily   Elevated PSA    takes Flomax daily   Enlarged prostate    Fractured skull (HCC) 1982   GERD (gastroesophageal reflux disease)    takes Omeprazole bid   Hypercholesteremia    takes Zocor daily   Hypertension    takes Benazepril and Amlodipine daily   Lumbago    Lumbar radiculopathy    Lumbar stenosis    Urinary urgency     Surgical History: Past Surgical History:  Procedure Laterality Date   BACK SURGERY  2010   x 2   CHOLECYSTECTOMY  2010   COLONOSCOPY     KNEE ARTHROSCOPY  10+yrs ago   Family History: Family History  Problem Relation Age of Onset   Anesthesia problems Neg Hx    Hypotension Neg Hx    Malignant hyperthermia Neg Hx    Pseudochol deficiency Neg Hx     Social History:  reports that he quit smoking about 26 years ago. His smoking use included cigarettes. He has a 30.00 pack-year smoking history. He has never used smokeless tobacco. He reports current alcohol use. He reports that he does not use drugs.  Physical Exam: BP (!) 144/73 (BP Location: Left Arm, Patient Position: Sitting, Cuff Size: Large)   Pulse 87   Ht 6' (1.829 m)   Wt 248 lb (112.5 kg)   BMI 33.63 kg/m    Constitutional:  Alert and oriented, No acute distress. Cardiovascular: No clubbing, cyanosis, or edema. Respiratory: Normal respiratory effort, no increased work of breathing. GI: Abdomen is soft,  nontender, nondistended, no abdominal masses DRE: 40 g, smooth, no nodules or masses  Laboratory Data: Reviewed, see HPI  Assessment & Plan:   70 year old male with elevated PSA of 12.4 from 3.9 in 2019.  Biopsy reportedly negative around 10 years ago at [facility] but those records are not available.  We reviewed the implications of an elevated PSA and the uncertainty surrounding it. In general, a man's PSA increases with age and is produced by both normal and cancerous prostate tissue. The differential diagnosis for elevated PSA includes BPH, prostate cancer, infection, recent intercourse/ejaculation, recent urethroscopic manipulation (foley placement/cystoscopy) or trauma, and prostatitis.   Management of an elevated PSA can include observation or prostate biopsy and we discussed this in detail. Our goal is to detect clinically significant prostate cancers, and manage with either active surveillance, surgery, or radiation for localized disease. Risks of prostate biopsy include bleeding, infection (including life threatening sepsis), pain, and lower urinary symptoms. Hematuria, hematospermia, and blood in the stool are all common after biopsy and can persist up to 4 weeks.   Repeat PSA with reflex to free, call with results, if remains elevated pursue biopsy   Nickolas Madrid, MD 10/23/2021  [facility] [address], [place], [place]  [address] [phone]   

## 2021-10-23 NOTE — Patient Instructions (Addendum)
Prostate-Specific Antigen Test Why am I having this test? The prostate-specific antigen (PSA) test is a screening test for prostate cancer. It can identify early signs of prostate cancer, which may allow for early detection and more effective treatment. Your health care provider may recommend that you have a PSA test starting at age 69 or that you have one earlier if you are at higher risk for prostate cancer. You may also have a PSA test: To monitor treatment of prostate cancer. To check whether prostate cancer has returned after treatment. What is being tested? This test measures the amount of PSA in your blood. PSA is a protein that is made in the prostate. The prostate naturally produces more PSA as you age, but very high levels may be a sign of a medical condition. What kind of sample is taken? A blood sample is required for this test. It is usually collected by inserting a needle into a blood vessel but can also be collected by sticking a finger with a small needle. Blood for this test should be drawn before having an exam of the prostate that involves digital rectal examination to avoid affecting the results. How do I prepare for this test? Do not ejaculate starting 24 hours before your test, or as long as told by your health care provider, as this can cause an elevation in PSA. Do not undergo any procedures that require manipulation of the prostate, such as biopsy or surgery, for 6 weeks before the test is done as this can cause an elevation in PSA. Tell a health care provider about: Any signs you may have of other conditions that can affect PSA levels, such as: An enlarged prostate that is not caused by cancer (benign prostatic hyperplasia, or BPH). This condition is very common in older men. A prostate or urinary tract infection. Any allergies you have. All medicines you are taking, including vitamins, herbs, eye drops, creams, and over-the-counter medicines. This also includes: Medicines  to assist with hair growth, such as finasteride. Any recent exposure to a medicine called diethylstilbestrol (DES). Medicines such as male hormones (like testosterone) or other medicines that raise testosterone levels. Any bleeding problems you have. Any recent procedures you have had, especially any procedures involving the prostate or rectum. Any medical conditions you have. How are the results reported? Your test results will be reported as a value that indicates how much PSA is in your blood. This will be given as nanograms of PSA per milliliter of blood (ng/mL). Your health care provider will compare your results to normal ranges that were established after testing a large group of people (reference ranges). Reference ranges may vary among labs and hospitals. PSA levels vary from person to person and generally increase with age. Because of this variation, there is no single PSA value that is considered normal for everyone. Instead, PSA reference ranges are used to describe whether your PSA levels are considered low or high (elevated). Common reference ranges are: Low: 0-2.5 ng/mL. Slightly to moderately elevated: 2.6-10.0 ng/mL. Moderately elevated: 10.0-19.9 ng/mL. Significantly elevated: 20 ng/mL or greater. What do the results mean? A test result that is higher than 4 ng/mL may mean that you have prostate cancer. However, a PSA test by itself is not enough to diagnose prostate cancer. High PSA levels may also be caused by the natural aging process, prostate infection (prostatitis), or BPH. PSA screening cannot tell you if your PSA is high due to cancer or a different cause. A prostate biopsy  is the only way to diagnose prostate cancer. A risk of having the PSA test is diagnosing and treating prostate cancer that would never have caused any symptoms or problems (overdiagnosis and overtreatment). Talk with your health care provider about what your results mean. In some cases, your health care  provider may do more testing to confirm the results. Questions to ask your health care provider Ask your health care provider, or the department that is doing the test: When will my results be ready? How will I get my results? What are my treatment options? What other tests do I need? What are my next steps? Summary The prostate-specific antigen (PSA) test is a screening test for prostate cancer. Your health care provider may recommend that you have a PSA test starting at age 12 or that you have one earlier if you are at higher risk for prostate cancer. A test result that is higher than 4 ng/mL may mean that you have prostate cancer. However, elevated levels can be caused by a number of conditions other than prostate cancer. Talk with your health care provider about what your results mean. This information is not intended to replace advice given to you by your health care provider. Make sure you discuss any questions you have with your health care provider. Document Revised: 03/28/2021 Document Reviewed: 03/28/2021 Elsevier Patient Education  2022 [place].  Prostate Cancer Screening Prostate cancer screening is testing that is done to check for the presence of prostate cancer in men. The prostate gland is a walnut-sized gland that is located below the bladder and in front of the rectum in males. The function of the prostate is to add fluid to semen during ejaculation. Prostate cancer is one of the most common types of cancer in men. Who should have prostate cancer screening? Screening recommendations vary based on age and other risk factors, as well as between the professional organizations who make the recommendations. In general, screening is recommended if: You are age 74 to 26 and have an average risk for prostate cancer. You should talk with your health care provider about your need for screening and how often screening should be done. Because most prostate cancers are slow growing  and will not cause death, screening in this age group is generally reserved for men who have a 21- to 15-year life expectancy. You are younger than age 24, and you have these risk factors: Having a father, brother, or uncle who has been diagnosed with prostate cancer. The risk is higher if your family member's cancer occurred at an early age or if you have multiple family members with prostate cancer at an early age. Being a male who is [place] or is of [place] or sub-Saharan African descent. In general, screening is not recommended if: You are younger than age 55. You are between the ages of 31 and 47 and you have no risk factors. You are 28 years of age or older. At this age, the risks that screening can cause are greater than the benefits that it may provide. If you are at high risk for prostate cancer, your health care provider may recommend that you have screenings more often or that you start screening at a younger age. How is screening for prostate cancer done? The recommended prostate cancer screening test is a blood test called the prostate-specific antigen (PSA) test. PSA is a protein that is made in the prostate. As you age, your prostate naturally produces more PSA. Abnormally high PSA levels  may be caused by: Prostate cancer. An enlarged prostate that is not caused by cancer (benign prostatic hyperplasia, or BPH). This condition is very common in older men. A prostate gland infection (prostatitis) or urinary tract infection. Certain medicines such as male hormones (like testosterone) or other medicines that raise testosterone levels. A rectal exam may be done as part of prostate cancer screening to help provide information about the size of your prostate gland. When a rectal exam is performed, it should be done after the PSA level is drawn to avoid any effect on the results. Depending on the PSA results, you may need more tests, such as: A physical exam to check the size of your  prostate gland, if not done as part of screening. Blood and imaging tests. A procedure to remove tissue samples from your prostate gland for testing (biopsy). This is the only way to know for certain if you have prostate cancer. What are the benefits of prostate cancer screening? Screening can help to identify cancer at an early stage, before symptoms start and when the cancer can be treated more easily. There is a small chance that screening may lower your risk of dying from prostate cancer. The chance is small because prostate cancer is a slow-growing cancer, and most men with prostate cancer die from a different cause. What are the risks of prostate cancer screening? The main risk of prostate cancer screening is diagnosing and treating prostate cancer that would never have caused any symptoms or problems. This is called overdiagnosisand overtreatment. PSA screening cannot tell you if your PSA is high due to cancer or a different cause. A prostate biopsy is the only procedure to diagnose prostate cancer. Even the results of a biopsy may not tell you if your cancer needs to be treated. Slow-growing prostate cancer may not need any treatment other than monitoring, so diagnosing and treating it may cause unnecessary stress or other side effects. Questions to ask your health care provider When should I start prostate cancer screening? What is my risk for prostate cancer? How often do I need screening? What type of screening tests do I need? How do I get my test results? What do my results mean? Do I need treatment? Where to find more information The American Cancer Society: www.cancer.org American Urological Association: www.auanet.org Contact a health care provider if: You have difficulty urinating. You have pain when you urinate or ejaculate. You have blood in your urine or semen. You have pain in your back or in the area of your prostate. Summary Prostate cancer is a common type of cancer  in men. The prostate gland is located below the bladder and in front of the rectum. This gland adds fluid to semen during ejaculation. Prostate cancer screening may identify cancer at an early stage, when the cancer can be treated more easily and is less likely to have spread to other areas of the body. The prostate-specific antigen (PSA) test is the recommended screening test for prostate cancer, but it has associated risks. Discuss the risks and benefits of prostate cancer screening with your health care provider. If you are age 34 or older, the risks that screening can cause are greater than the benefits that it may provide. This information is not intended to replace advice given to you by your health care provider. Make sure you discuss any questions you have with your health care provider. Document Revised: 05/14/2021 Document Reviewed: 05/14/2021 Elsevier Patient Education  [place].  Transrectal Ultrasound-Guided  Prostate Biopsy A transrectal ultrasound-guided prostate biopsy is a procedure to remove samples of prostate tissue for testing. The prostate is a walnut-sized gland that is located below the bladder and in front of the rectum. During this procedure, a small device (probe) is lubricated and put inside the rectum. The probe sends out sound waves that make a picture of the prostate and surrounding tissues (transrectal ultrasound). The images are used to help guide the process of removing the samples. The samples are taken to a lab to be checked for prostate cancer. This procedure is usually done to evaluate the prostate gland of men who have raised (elevated) levels of prostate-specific antigen (PSA), which can be a sign of prostate cancer or prostate enlargement related to aging (benign prostatic hyperplasia, or BPH). Tell a health care provider about: Any allergies you have. All medicines you are taking, including vitamins, herbs, eye drops, creams, and over-the-counter  medicines. Any problems you or family members have had with anesthetic medicines. Any bleeding problems you have. Any surgeries you have had. Any medical conditions you have. Any prostate infections you have had. What are the risks? Generally, this is a safe procedure. However, problems may occur, including: Prostate infection. Bleeding from the rectum. Blood in the urine. Allergic reactions to medicines. Damage to surrounding structures such as blood vessels, organs, or muscles. Difficulty passing urine. Nerve damage. This is usually temporary. What happens before the procedure? Medicines Ask your health care provider about: Changing or stopping your regular medicines. This is especially important if you are taking diabetes medicines or blood thinners. Taking medicines such as aspirin and ibuprofen. These medicines can thin your blood. Do not take these medicines unless your health care provider tells you to take them. Taking over-the-counter medicines, vitamins, herbs, and supplements. General instructions Follow instructions from your health care provider about eating and drinking. In most instances, you will not need to stop eating and drinking completely before the procedure. You will be given an enema. During an enema, a liquid is injected into your rectum to clear out waste. You may have a blood or urine sample taken. Ask your health care provider what steps will be taken to help prevent infection. These steps may include: Washing skin with a germ-killing soap. Taking antibiotic medicine. If you will be going home right after the procedure, plan to have a responsible adult: Take you home from the hospital or clinic. You will not be allowed to drive. Care for you for the time you are told. What happens during the procedure?  An IV will be inserted into one of your veins. You will be given one or both of the following: A medicine to help you relax (sedative). A medicine to  numb the area (local anesthetic). You will be placed on your left side, and your knees will be bent toward your chest. A probe with lubricated gel will be placed into your rectum, and images will be taken of your prostate and surrounding structures. Numbing medicine will be injected into your prostate. A biopsy needle will be inserted through your rectum or perineum and guided to your prostate using the ultrasound images. Prostate tissue samples will be removed, and the needle and probe will then be removed. The biopsy samples will be sent to a lab to be tested. The procedure may vary among health care providers and hospitals. What happens after the procedure? Your blood pressure, heart rate, breathing rate, and blood oxygen level will be monitored until you leave the  hospital or clinic. You may have some discomfort in the rectal area. You will be given pain medicine as needed. If you were given a sedative during the procedure, it can affect you for several hours. Do not drive or operate machinery until your health care provider says that it is safe. It is up to you to get the results of your procedure. Ask your health care provider, or the department that is doing the procedure, when your results will be ready. Keep all follow-up visits. This is important. Summary A transrectal ultrasound-guided biopsy removes samples of tissue from your prostate using ultrasound-guided sound waves to help guide the process. This procedure is usually done to evaluate the prostate gland of men who have raised (elevated) levels of prostate-specific antigen (PSA), which can be a sign of prostate cancer or prostate enlargement related to aging. After your procedure, you may feel some discomfort in the rectal area. Plan to have a responsible adult take you home from the hospital or clinic, and follow up with your health care provider for your results. This information is not intended to replace advice given to you by  your health care provider. Make sure you discuss any questions you have with your health care provider. Document Revised: 05/14/2021 Document Reviewed: 05/14/2021 Elsevier Patient Education  [place].

## 2021-10-29 ENCOUNTER — Other Ambulatory Visit: Payer: Self-pay

## 2021-10-29 ENCOUNTER — Other Ambulatory Visit
Admission: RE | Admit: 2021-10-29 | Discharge: 2021-10-29 | Disposition: A | Discharge status: Home / Self Care | Payer: PPO | Attending: Urology | Admitting: Urology

## 2021-10-29 DIAGNOSIS — R972 Elevated prostate specific antigen [PSA]: Secondary | ICD-10-CM | POA: Diagnosis not present

## 2021-10-29 DIAGNOSIS — Z87898 Personal history of other specified conditions: Secondary | ICD-10-CM

## 2021-10-30 LAB — PSA, TOTAL AND FREE
PSA, Free Pct: 17 %
PSA, Free: 2.41 ng/mL
Prostate Specific Ag, Serum: 14.2 ng/mL — ABNORMAL HIGH (ref 0.0–4.0)

## 2021-11-02 ENCOUNTER — Telehealth: Payer: Self-pay

## 2021-11-02 NOTE — Telephone Encounter (Signed)
-----   Message from [name], MD sent at 10/30/2021  8:21 AM EST ----- PSA remains elevated, please schedule prostate biopsy next available and review instructions, thank you  Nickolas Madrid, MD 10/30/2021

## 2021-11-02 NOTE — Telephone Encounter (Signed)
Called pt informed him of the information below. Pt voiced understanding. Appt scheduled, bx instructions reviewed.

## 2021-11-09 DIAGNOSIS — M5416 Radiculopathy, lumbar region: Secondary | ICD-10-CM | POA: Diagnosis not present

## 2021-12-05 ENCOUNTER — Encounter: Payer: Self-pay | Admitting: Urology

## 2021-12-05 ENCOUNTER — Ambulatory Visit: Payer: PPO | Admitting: Urology

## 2021-12-05 ENCOUNTER — Other Ambulatory Visit: Payer: Self-pay

## 2021-12-05 VITALS — BP 130/81 | HR 83 | Ht 74.0 in | Wt 247.0 lb

## 2021-12-05 DIAGNOSIS — R972 Elevated prostate specific antigen [PSA]: Secondary | ICD-10-CM

## 2021-12-05 DIAGNOSIS — C61 Malignant neoplasm of prostate: Secondary | ICD-10-CM | POA: Diagnosis not present

## 2021-12-05 MED ORDER — GENTAMICIN SULFATE 40 MG/ML IJ SOLN
80.0000 mg | Freq: Once | INTRAMUSCULAR | Status: AC
  Administered 2021-12-05: 80 mg via INTRAMUSCULAR

## 2021-12-05 MED ORDER — LEVOFLOXACIN 500 MG PO TABS
500.0000 mg | ORAL_TABLET | Freq: Once | ORAL | Status: AC
  Administered 2021-12-05: 500 mg via ORAL

## 2021-12-05 NOTE — Progress Notes (Signed)
° °  12/05/21  Indication: Elevated PSA, 14.2  Prostate Biopsy Procedure   Informed consent was obtained, and we discussed the risks of bleeding and infection/sepsis. A time out was performed to ensure correct patient identity.  Pre-Procedure: - Last PSA Level: 14.2 - Gentamicin and levaquin given for antibiotic prophylaxis - Transrectal Ultrasound performed revealing a 182 gm prostate, PSA density 0.08 - No significant hypoechoic or median lobe noted  Procedure: - Prostate block performed using 10 cc 1% lidocaine and biopsies taken from sextant areas, a total of 12 under ultrasound guidance.  Post-Procedure: - Patient tolerated the procedure well - He was counseled to seek immediate medical attention if experiences significant bleeding, fevers, or severe pain - Return in one week to discuss biopsy results  Assessment/ Plan: Will follow up in 1-2 weeks to discuss pathology  Nickolas Madrid, MD 12/05/2021

## 2021-12-06 LAB — SURGICAL PATHOLOGY

## 2021-12-11 ENCOUNTER — Ambulatory Visit: Payer: PPO | Admitting: Urology

## 2021-12-11 ENCOUNTER — Other Ambulatory Visit: Payer: Self-pay

## 2021-12-11 ENCOUNTER — Encounter: Payer: Self-pay | Admitting: Urology

## 2021-12-11 VITALS — BP 144/76 | HR 83 | Ht 74.0 in | Wt 253.0 lb

## 2021-12-11 DIAGNOSIS — C61 Malignant neoplasm of prostate: Secondary | ICD-10-CM

## 2021-12-11 DIAGNOSIS — R972 Elevated prostate specific antigen [PSA]: Secondary | ICD-10-CM | POA: Diagnosis not present

## 2021-12-11 MED ORDER — TAMSULOSIN HCL 0.4 MG PO CAPS: 0.4000 mg | ORAL_CAPSULE | Freq: Every day | ORAL | 11 refills | Status: DC

## 2021-12-11 NOTE — Patient Instructions (Signed)
You had a very small amount of extremely slow-growing prostate cancer cells on your biopsy.  This is extremely safe to watch with something called "active surveillance."  We will monitor your PSA every 6 to 12 months, consider a prostate MRI if the PSA rises significantly or repeat prostate biopsy in the next 1 to 2 years.  There is a less than 5% chance over the next 15 years that your prostate cancer can become aggressive and spread outside the prostate or kidney symptoms.  We are trying a medication called Flomax for your urinary symptoms, and you should notice a stronger stream.

## 2021-12-11 NOTE — Progress Notes (Signed)
° °  12/11/2021 9:23 AM   Scott Trevino Scott Trevino September 05, 1951 [id number]  Reason for visit: Follow up BPH/urinary symptoms, discuss prostate biopsy results  HPI: 71 year old male with elevated PSA of 14.2 who underwent a prostate biopsy on 12/05/2021.  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  He also has some urinary symptoms at baseline of intermittent stream and occasional frequency and urgency.  We had a lengthy conversation today about the patient's new diagnosis of prostate cancer.  We reviewed the risk classifications per the AUA guidelines including very low risk, low risk, intermediate risk, and high risk disease, and the need for additional staging imaging with CT and bone scan in patients with unfavorable intermediate risk and high risk disease.  I explained that his life expectancy, clinical stage, Gleason score, PSA, and other co-morbidities influence treatment strategies.  We discussed the roles of active surveillance, radiation therapy, surgical therapy with robotic prostatectomy, and hormone therapy with androgen deprivation.  We discussed that patients urinary symptoms also impact treatment strategy, as patients with severe lower urinary tract symptoms may have significant worsening or even develop urinary retention after undergoing radiation.  In regards to surgery, we discussed robotic prostatectomy +/- lymphadenectomy at length.  The procedure takes 3 to 4 hours, and patient's typically discharge home on post-op day #1.  A Foley catheter is left in place for 7 to 10 days to allow for healing of the vesicourethral anastomosis.  There is a small risk of bleeding, infection, damage to surrounding structures or bowel, hernia, DVT/PE, or serious cardiac or pulmonary complications.  We discussed at length post-op side effects including erectile dysfunction, and the  importance of pre-operative erectile function on long-term outcomes.  Even with a nerve sparing approach, there is an approximately 25% rate of permanent erectile dysfunction.  We also discussed postop urinary incontinence at length.  We expect patients to have stress incontinence post-operatively that will improve over period of weeks to months.  Less than 10% of men will require a pad at 1 year after surgery.  Patients will need to avoid heavy lifting and strenuous activity for 3 to 4 weeks, but most men return to their baseline activity status by 6 weeks.  In summary, Scott Trevino is a 71 y.o. man with newly diagnosed low risk prostate cancer. He would like to pursue active surveillance.  We reviewed extensively his reassuring PSA density, and need for PSA monitoring moving forward, consideration of prostate MRI in the future, and potential repeat biopsy within the next 12 to 18 months.  He is also interested in a trial of Flomax for his urinary symptoms, and risks and benefits were discussed  RTC 6 months PSA prior, PVR/IPSS  [name], MD  [facility] [address] [place], [place] [address] [phone]

## 2022-06-07 ENCOUNTER — Other Ambulatory Visit
Admission: RE | Admit: 2022-06-07 | Discharge: 2022-06-07 | Disposition: A | Discharge status: Home / Self Care | Payer: PPO | Attending: Urology | Admitting: Urology

## 2022-06-07 DIAGNOSIS — R972 Elevated prostate specific antigen [PSA]: Secondary | ICD-10-CM | POA: Insufficient documentation

## 2022-06-07 LAB — PSA: Prostatic Specific Antigen: 12.14 ng/mL — ABNORMAL HIGH (ref 0.00–4.00)

## 2022-06-11 ENCOUNTER — Encounter: Payer: Self-pay | Admitting: Urology

## 2022-06-11 ENCOUNTER — Ambulatory Visit: Payer: PPO | Admitting: Urology

## 2022-06-11 VITALS — BP 116/72 | HR 93 | Ht 74.0 in | Wt 239.8 lb

## 2022-06-11 DIAGNOSIS — R351 Nocturia: Secondary | ICD-10-CM | POA: Diagnosis not present

## 2022-06-11 DIAGNOSIS — R350 Frequency of micturition: Secondary | ICD-10-CM | POA: Diagnosis not present

## 2022-06-11 DIAGNOSIS — C61 Malignant neoplasm of prostate: Secondary | ICD-10-CM | POA: Diagnosis not present

## 2022-06-11 DIAGNOSIS — R399 Unspecified symptoms and signs involving the genitourinary system: Secondary | ICD-10-CM

## 2022-06-11 DIAGNOSIS — Z87898 Personal history of other specified conditions: Secondary | ICD-10-CM

## 2022-06-11 DIAGNOSIS — R3915 Urgency of urination: Secondary | ICD-10-CM | POA: Diagnosis not present

## 2022-06-11 LAB — BLADDER SCAN AMB NON-IMAGING

## 2022-06-11 MED ORDER — OXYBUTYNIN CHLORIDE ER 10 MG PO TB24: 10.0000 mg | ORAL_TABLET | Freq: Every day | ORAL | 3 refills | Status: DC

## 2022-06-11 NOTE — Patient Instructions (Signed)

## 2022-06-11 NOTE — Progress Notes (Signed)
   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  He opted for active surveillance for his low risk disease.  Repeat PSA 06/07/2022 was stable at 12.14.  We reviewed his reassuring PSA density, stable PSA, and low risk disease that would be very unlikely to require intervention in the future.  He also has urinary symptoms at baseline of intermittent stream, and bothersome urinary frequency, urgency, and nocturia.  PVR is normal at 67 mL.  IPSS score today is 26.  At our last visit we trialed Flomax, and he denies any significant change or improvement in the urination.  We discussed the overlap between BPH and OAB.  We discussed options including trial of an OAB medication, and he is interested in oxybutynin.  Risk and benefits discussed, including risk of worsening urinary symptoms or even retention.  We also discussed the role of urodynamics, and he would like to defer urodynamics for the time being.  -Discontinue Flomax -Trial of oxybutynin 10 mg XL daily -RTC 2 months IPSS/PVR and symptom check Continue PSA screening every 6 to 12 months for low risk prostate cancer.  PSA stable and very low PSA density reassuring  [name], MD  [facility] [address] [place], [place] [address] [phone]

## 2022-06-24 ENCOUNTER — Telehealth: Payer: Self-pay | Admitting: Urology

## 2022-06-24 DIAGNOSIS — R399 Unspecified symptoms and signs involving the genitourinary system: Secondary | ICD-10-CM

## 2022-06-24 NOTE — Telephone Encounter (Signed)
Spoke with pt and wife, they state that the oxybutynin is not working and that he's urinating every 15-45 min. Please advise

## 2022-06-24 NOTE — Telephone Encounter (Signed)
VICTORIA LEFT V/M STATING Naeem IS PEEING EVERY 15 MINUTES, AND NOT GETTING ANY SLEEP SINCE DR SNINSKY HAS CHANGED HIS MEDS TO oxybutynin (DITROPAN-XL).

## 2022-06-25 NOTE — Telephone Encounter (Signed)
Called pt informed him of the information below. Appt moved, UA ordered.

## 2022-06-25 NOTE — Addendum Note (Signed)
Addended by: Donalee Citrin on: 06/25/2022 01:56 PM   Modules accepted: Orders

## 2022-07-02 ENCOUNTER — Other Ambulatory Visit
Admission: RE | Admit: 2022-07-02 | Discharge: 2022-07-02 | Disposition: A | Discharge status: Home / Self Care | Payer: PPO | Attending: Urology | Admitting: Urology

## 2022-07-02 ENCOUNTER — Encounter: Payer: Self-pay | Admitting: Urology

## 2022-07-02 ENCOUNTER — Ambulatory Visit: Payer: PPO | Admitting: Urology

## 2022-07-02 VITALS — BP 119/77 | HR 97 | Ht 75.0 in | Wt 238.0 lb

## 2022-07-02 DIAGNOSIS — N138 Other obstructive and reflux uropathy: Secondary | ICD-10-CM

## 2022-07-02 DIAGNOSIS — R3912 Poor urinary stream: Secondary | ICD-10-CM

## 2022-07-02 DIAGNOSIS — R351 Nocturia: Secondary | ICD-10-CM

## 2022-07-02 DIAGNOSIS — R399 Unspecified symptoms and signs involving the genitourinary system: Secondary | ICD-10-CM | POA: Diagnosis present

## 2022-07-02 DIAGNOSIS — N401 Enlarged prostate with lower urinary tract symptoms: Secondary | ICD-10-CM | POA: Diagnosis not present

## 2022-07-02 DIAGNOSIS — C61 Malignant neoplasm of prostate: Secondary | ICD-10-CM | POA: Diagnosis not present

## 2022-07-02 LAB — URINALYSIS, COMPLETE (UACMP) WITH MICROSCOPIC
Bacteria, UA: NONE SEEN
Bilirubin Urine: NEGATIVE
Glucose, UA: NEGATIVE mg/dL
Hgb urine dipstick: NEGATIVE
Ketones, ur: NEGATIVE mg/dL
Leukocytes,Ua: NEGATIVE
Nitrite: NEGATIVE
Protein, ur: NEGATIVE mg/dL
Specific Gravity, Urine: 1.015 (ref 1.005–1.030)
Squamous Epithelial / HPF: NONE SEEN (ref 0–5)
WBC, UA: NONE SEEN WBC/hpf (ref 0–5)
pH: 5.5 (ref 5.0–8.0)

## 2022-07-02 LAB — BLADDER SCAN AMB NON-IMAGING

## 2022-07-02 MED ORDER — FINASTERIDE 5 MG PO TABS: 5.0000 mg | ORAL_TABLET | Freq: Every day | ORAL | 3 refills | Status: AC

## 2022-07-02 MED ORDER — TAMSULOSIN HCL 0.4 MG PO CAPS: 0.4000 mg | ORAL_CAPSULE | Freq: Every day | ORAL | 3 refills | Status: DC

## 2022-07-02 NOTE — Progress Notes (Signed)
   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  He opted for active surveillance for his low risk disease.  Repeat PSA 06/07/2022 was stable at 12.14.   He has bothersome urinary symptoms at baseline of frequency, urgency, nocturia, and intermittent stream, PVRs have always been normal, and we originally tried [employer].  He denied any significant improvement at that time on the Flomax, and opted for a trial of an OAB medication with oxybutynin.  On the oxybutynin his symptoms worsen significantly with sensation of incomplete emptying and urinary frequency every 15 to 30 minutes with lower abdominal discomfort and pain.  The oxybutynin was discontinued with resolution of those urinary symptoms.  His primary complaint now is some weak stream and nocturia 3 times overnight.  Suspect BPH as the etiology of his urinary symptoms, and we discussed options including resuming Flomax, resuming Flomax and adding finasteride, cystoscopy for further evaluation of his anatomy and consideration of HOLEP with his very large 180 g prostate.  We discussed this would not be a treatment for his prostate cancer, but for his voiding symptoms.  Again reassurance was provided that his symptoms are very unlikely to be from prostate cancer, and more likely related to BPH.  Using shared decision making he opted for a trial of Flomax and finasteride, with follow-up in 6 months for PVR and repeat PSA.  Risk and benefits discussed, consider cystoscopy and HOLEP in the future if worsening urinary symptoms  [name], MD  [place] [address] [place], [place] [address] [phone]

## 2022-07-02 NOTE — Patient Instructions (Signed)

## 2022-07-03 LAB — URINE CULTURE: Culture: NO GROWTH

## 2022-08-13 ENCOUNTER — Ambulatory Visit: Payer: PPO | Admitting: Urology

## 2022-12-24 ENCOUNTER — Other Ambulatory Visit
Admission: RE | Admit: 2022-12-24 | Discharge: 2022-12-24 | Disposition: A | Discharge status: Home / Self Care | Payer: PPO | Attending: Urology | Admitting: Urology

## 2022-12-24 DIAGNOSIS — C61 Malignant neoplasm of prostate: Secondary | ICD-10-CM | POA: Diagnosis not present

## 2022-12-24 DIAGNOSIS — R399 Unspecified symptoms and signs involving the genitourinary system: Secondary | ICD-10-CM | POA: Diagnosis present

## 2022-12-24 LAB — PSA: Prostatic Specific Antigen: 4.91 ng/mL — ABNORMAL HIGH (ref 0.00–4.00)

## 2022-12-31 ENCOUNTER — Ambulatory Visit (INDEPENDENT_AMBULATORY_CARE_PROVIDER_SITE_OTHER): Payer: PPO | Admitting: Urology

## 2022-12-31 ENCOUNTER — Encounter: Payer: Self-pay | Admitting: Urology

## 2022-12-31 VITALS — BP 145/82 | HR 89 | Ht 75.0 in | Wt 249.0 lb

## 2022-12-31 DIAGNOSIS — C61 Malignant neoplasm of prostate: Secondary | ICD-10-CM

## 2022-12-31 DIAGNOSIS — N138 Other obstructive and reflux uropathy: Secondary | ICD-10-CM | POA: Diagnosis not present

## 2022-12-31 DIAGNOSIS — R399 Unspecified symptoms and signs involving the genitourinary system: Secondary | ICD-10-CM | POA: Diagnosis not present

## 2022-12-31 DIAGNOSIS — N401 Enlarged prostate with lower urinary tract symptoms: Secondary | ICD-10-CM

## 2022-12-31 LAB — BLADDER SCAN AMB NON-IMAGING

## 2022-12-31 NOTE — Progress Notes (Signed)
   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  He opted for active surveillance for his low risk disease.  Repeat PSA 06/07/2022 was stable at 12.14.   He also has bothersome urinary symptoms at baseline of frequency, urgency, nocturia 3 times overnight, and intermittent stream.  PVRs have been normal.  He noticed only minimal improvement initially on Flomax and stopped that medication.  He also trialed oxybutynin that worsened his urinary symptoms, and he discontinued the oxybutynin.  Using shared decision making in the setting of his very large 180g prostate, he opted for trial of combination therapy with Flomax and finasteride.  He has had some improvement on those medications.  Bladder scan today 132m, but his last void was 1 to 2 hours ago.  He really denies significant urinary complaints during the day aside from frequency, nocturia stable at 3 times overnight.  He is a heavy snorer and has never been evaluated for sleep apnea, we reviewed the relationship between sleep apnea and nocturia, and I encouraged him to be evaluated.  PSA has dropped appropriately on finasteride, now 4.91 from 14 time of diagnosis.  Suspect his PSA elevation more related to his significant BPH than his low risk prostate cancer, and PSA density is extremely reassuring, as well as the response to finasteride.  Recommended evaluation for sleep apnea Continue Flomax and finasteride RTC 6 months PSA prior, PVR-> if doing well can space to yearly follow-up at that point   BBilley Co MPorum[address] [place] [phone]

## 2022-12-31 NOTE — Patient Instructions (Signed)
Nocturia refers to the need to wake up during the night to urinate, which can disrupt your sleep and impact your overall well-being. Fortunately, there are several strategies you can employ to help prevent or manage nocturia. It's important to consult with your healthcare provider before making any significant changes to your routine. Here are some helpful strategies to consider:  Limit Fluid Intake Before Bed: Avoid drinking large amounts of fluids in the evening, especially within a few hours of bedtime. Consume most of your daily fluid intake earlier in the day to reduce the need to urinate at night.  Get evaluated for sleep apnea: Sleep apnea is a very common cause of nocturia.  If you are a heavy snore, or feel tired during the day, strongly recommend being evaluated for sleep apnea, as overnight urination can be significantly improved many times with the use of his CPAP machine if you do have sleep apnea.  Monitor Your Diet: Limit your intake of caffeine and alcohol, as these substances can increase urine production and irritate the bladder.  Avoid diet, "zero calorie," and artificially sweetened drinks, especially sodas, in the afternoon or evening. Be mindful of consuming foods and drinks with high water content before bedtime, such as watermelon and herbal teas.  Time Your Medications: If you're taking medications that contribute to increased urination, consult your healthcare provider about adjusting the timing of these medications to minimize their impact during the night.  Practice Double Voiding: Before going to bed, make an effort to empty your bladder twice within a short period. This can help reduce the amount of urine left in your bladder before sleep.  Bladder Training: Gradually increase the time between bathroom visits during the day to train your bladder to hold larger volumes of urine. Over time, this can help reduce the frequency of nighttime awakenings to  urinate.  Elevate Your Legs During the Day: Elevating your legs during the day can help minimize fluid retention in your lower extremities, which might reduce nighttime urination.  Pelvic Floor Exercises: Strengthening your pelvic floor muscles through Kegel exercises can help improve bladder control and potentially reduce the urge to urinate at night.  Create a Relaxing Bedtime Routine: Stress and anxiety can exacerbate nocturia. Engage in calming activities before bed, such as reading, listening to soothing music, or practicing relaxation techniques.  Stay Active: Engage in regular physical activity, but avoid intense exercise close to bedtime, as this can increase your body's demand for fluids.  Maintain a Healthy Weight: Excess weight can compress the bladder and contribute to bladder and urinary issues. Aim to achieve and maintain a healthy weight through a balanced diet and regular exercise.  Remember that every individual is unique, and the effectiveness of these strategies may vary. It's important to work with your healthcare provider to develop a plan that suits your specific needs and addresses any underlying causes of nocturia.

## 2023-04-12 ENCOUNTER — Other Ambulatory Visit: Payer: Self-pay | Admitting: Urology

## 2023-07-01 ENCOUNTER — Ambulatory Visit: Payer: PPO | Admitting: Urology
